# Patient Record
Sex: Female | Born: 1955 | Race: White | Hispanic: No | Marital: Married | Smoking: Former smoker
Health system: Southern US, Community
[De-identification: ages and names within clinical notes are randomized; demographics above are authoritative.]

## PROBLEM LIST (undated history)

## (undated) DIAGNOSIS — Z9221 Personal history of antineoplastic chemotherapy: Secondary | ICD-10-CM

## (undated) DIAGNOSIS — R0789 Other chest pain: Secondary | ICD-10-CM

## (undated) DIAGNOSIS — Z720 Tobacco use: Secondary | ICD-10-CM

## (undated) DIAGNOSIS — D229 Melanocytic nevi, unspecified: Secondary | ICD-10-CM

## (undated) DIAGNOSIS — C50919 Malignant neoplasm of unspecified site of unspecified female breast: Secondary | ICD-10-CM

## (undated) DIAGNOSIS — R002 Palpitations: Secondary | ICD-10-CM

## (undated) DIAGNOSIS — C541 Malignant neoplasm of endometrium: Secondary | ICD-10-CM

## (undated) DIAGNOSIS — L309 Dermatitis, unspecified: Secondary | ICD-10-CM

## (undated) HISTORY — DX: Tobacco use: Z72.0

## (undated) HISTORY — DX: Malignant neoplasm of unspecified site of unspecified female breast: C50.919

## (undated) HISTORY — DX: Other chest pain: R07.89

## (undated) HISTORY — DX: Palpitations: R00.2

## (undated) HISTORY — DX: Dermatitis, unspecified: L30.9

---
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
## 1997-10-10 HISTORY — PX: KNEE SURGERY: SHX244

## 2008-10-10 DIAGNOSIS — C50919 Malignant neoplasm of unspecified site of unspecified female breast: Secondary | ICD-10-CM

## 2008-10-10 HISTORY — PX: MASTECTOMY: SHX3

## 2008-10-10 HISTORY — DX: Malignant neoplasm of unspecified site of unspecified female breast: C50.919

## 2009-10-10 HISTORY — PX: AUGMENTATION MAMMAPLASTY: SUR837

## 2010-10-10 HISTORY — PX: ABDOMINAL HYSTERECTOMY: SHX81

## 2014-05-26 ENCOUNTER — Other Ambulatory Visit: Payer: Self-pay | Admitting: *Deleted

## 2014-06-11 ENCOUNTER — Other Ambulatory Visit: Payer: Self-pay | Admitting: *Deleted

## 2014-06-11 DIAGNOSIS — C50919 Malignant neoplasm of unspecified site of unspecified female breast: Secondary | ICD-10-CM

## 2014-06-12 ENCOUNTER — Other Ambulatory Visit: Payer: Self-pay | Admitting: *Deleted

## 2014-06-12 DIAGNOSIS — M8580 Other specified disorders of bone density and structure, unspecified site: Secondary | ICD-10-CM

## 2014-06-26 ENCOUNTER — Other Ambulatory Visit: Payer: Self-pay

## 2014-07-02 ENCOUNTER — Ambulatory Visit
Admission: RE | Admit: 2014-07-02 | Discharge: 2014-07-02 | Disposition: A | Discharge status: Home / Self Care | Payer: 59 | Source: Ambulatory Visit | Attending: *Deleted | Admitting: *Deleted

## 2014-07-02 DIAGNOSIS — M8580 Other specified disorders of bone density and structure, unspecified site: Secondary | ICD-10-CM

## 2014-07-09 ENCOUNTER — Telehealth: Payer: Self-pay | Admitting: *Deleted

## 2014-07-09 NOTE — Telephone Encounter (Signed)
Received referral from Medical Records today and I called the pt and confirmed 08/20/14 med onc appt w/ her.  Unable to schedule the lab appt - gave to [facility] to enter it for me.  Mailed welcoming packet, calendar and intake form to pt.  Laroy Apple at referring to make her aware.  Took paperwork to [employer] in Medical Records to create chart.

## 2014-07-30 ENCOUNTER — Encounter: Payer: Self-pay | Admitting: *Deleted

## 2014-07-30 NOTE — Progress Notes (Signed)
Completed chart, labs entered, added to spreadsheet & placed in Dr. Magrinat's box.  

## 2014-08-19 ENCOUNTER — Other Ambulatory Visit: Payer: Self-pay | Admitting: *Deleted

## 2014-08-19 DIAGNOSIS — Z853 Personal history of malignant neoplasm of breast: Secondary | ICD-10-CM

## 2014-08-20 ENCOUNTER — Encounter (INDEPENDENT_AMBULATORY_CARE_PROVIDER_SITE_OTHER): Payer: Self-pay

## 2014-08-20 ENCOUNTER — Ambulatory Visit (HOSPITAL_BASED_OUTPATIENT_CLINIC_OR_DEPARTMENT_OTHER): Payer: 59 | Admitting: Oncology

## 2014-08-20 ENCOUNTER — Ambulatory Visit (HOSPITAL_BASED_OUTPATIENT_CLINIC_OR_DEPARTMENT_OTHER): Payer: 59

## 2014-08-20 ENCOUNTER — Ambulatory Visit: Payer: 59

## 2014-08-20 VITALS — BP 117/65 | HR 75 | Temp 98.3°F | Resp 18 | Ht 66.0 in | Wt 167.2 lb

## 2014-08-20 DIAGNOSIS — Z853 Personal history of malignant neoplasm of breast: Secondary | ICD-10-CM

## 2014-08-20 DIAGNOSIS — E876 Hypokalemia: Secondary | ICD-10-CM

## 2014-08-20 DIAGNOSIS — C50412 Malignant neoplasm of upper-outer quadrant of left female breast: Secondary | ICD-10-CM

## 2014-08-20 DIAGNOSIS — Z170 Estrogen receptor positive status [ER+]: Secondary | ICD-10-CM

## 2014-08-20 DIAGNOSIS — C50912 Malignant neoplasm of unspecified site of left female breast: Secondary | ICD-10-CM

## 2014-08-20 DIAGNOSIS — R918 Other nonspecific abnormal finding of lung field: Secondary | ICD-10-CM

## 2014-08-20 LAB — COMPREHENSIVE METABOLIC PANEL
ALBUMIN: 3.9 g/dL (ref 3.5–5.2)
ALK PHOS: 77 U/L (ref 39–117)
ALT: 15 U/L (ref 0–35)
AST: 17 U/L (ref 0–37)
BUN: 9 mg/dL (ref 6–23)
CO2: 27 mEq/L (ref 19–32)
Calcium: 9.5 mg/dL (ref 8.4–10.5)
Chloride: 103 mEq/L (ref 96–112)
Creatinine, Ser: 0.69 mg/dL (ref 0.50–1.10)
GLUCOSE: 96 mg/dL (ref 70–99)
POTASSIUM: 3.8 meq/L (ref 3.5–5.3)
Sodium: 141 mEq/L (ref 135–145)
Total Protein: 7.3 g/dL (ref 6.0–8.3)

## 2014-08-20 LAB — CBC WITH DIFFERENTIAL/PLATELET
BASO%: 0.7 % (ref 0.0–2.0)
Basophils Absolute: 0.1 10*3/uL (ref 0.0–0.1)
EOS ABS: 0.2 10*3/uL (ref 0.0–0.5)
EOS%: 1.6 % (ref 0.0–7.0)
HCT: 41.4 % (ref 34.8–46.6)
HGB: 13.6 g/dL (ref 11.6–15.9)
LYMPH%: 55.7 % — AB (ref 14.0–49.7)
MCH: 30.3 pg (ref 25.1–34.0)
MCHC: 32.7 g/dL (ref 31.5–36.0)
MCV: 92.4 fL (ref 79.5–101.0)
MONO#: 0.6 10*3/uL (ref 0.1–0.9)
MONO%: 5.7 % (ref 0.0–14.0)
NEUT#: 3.8 10*3/uL (ref 1.5–6.5)
NEUT%: 36.3 % — ABNORMAL LOW (ref 38.4–76.8)
PLATELETS: 208 10*3/uL (ref 145–400)
RBC: 4.48 10*6/uL (ref 3.70–5.45)
RDW: 12.8 % (ref 11.2–14.5)
WBC: 10.6 10*3/uL — ABNORMAL HIGH (ref 3.9–10.3)
lymph#: 5.9 10*3/uL — ABNORMAL HIGH (ref 0.9–3.3)

## 2014-08-20 NOTE — Progress Notes (Signed)
Holly Melton  [phone] [phone]     ID: Holly Melton DOB: 1955-10-11  MR#: [id number]  CWC#:[id number]  Patient Care Team: No Pcp Per Patient as PCP - General (General Practice) Chauncey Cruel, MD as Consulting Physician (Oncology) OTHER MD: Ashok Pall MD  CHIEF COMPLAINT: estrogen receptor positive breast cancer  CURRENT TREATMENT: anastrozole   BREAST CANCER HISTORY: [place] underwent left mastectomy and sentinel lymph node sampling 01/19/2009 for an mpT1c pN0, stage IA invasive breast cancer, which was estrogen receptor positive at 96%, and HER-2 amplified at 3+. [I do not have the actual pathology report; the patient recalls the tumor being progesterone receptor negative, but there is a note from one of her physicians that it was progesterone receptor 80% positive). She was treated under the care of Dr. Luther Hearing in [place] with dose dense doxorubicin and cyclophosphamide followed by dose dense paclitaxel. She also received trastuzumab, which was started duriing the paclitaxel treatment and continued for 1 year. At the completion of chemotherapy she was started on tamoxifen, and continued on that medication between October 2010 and October 2012. In May 2011 she underwent reconstructive surgery with a silicone implant.On September 2012 the patient underwent TAH/BSO with benign pathology. She was then switched to anastrozole, on which she continues.  Her subsequent history is as detailed below, as are other not necessarily related issues which will require further evaluation  INTERVAL HISTORY: Holly Melton was evaluated in the breast clinic 08/20/2014. She is establishing herself on my service today.  REVIEW OF SYSTEMS: She is tolerating the anastrozole generally well. She does have some hot flashes, but they're not something she would want any intervention 4. She denies significant arthralgias or myalgias. Vaginal dryness is "not an issue".  She feels tired at times, sometimes has palpitations, possibly related to caffeine intake. She has chronic low back pain, which is not more persistent or intense than usual. Sometimes she has numbness over her fingertips and toe tips, but this is not persistent either. A detailed review of systems was otherwise noncontributory  PAST MEDICAL HISTORY: No past medical history on file. History of multiple lipomas, history of hypokalemia, history of an occipital bone lesion, history of pulmonary nodules of uncertain etiology, history of thyroid nodules of uncertain etiology, osteopenia, history of abnormal cervical pathology with HPV positivity, status post robotic total hysterectomy  PAST SURGICAL HISTORY: No past surgical history on file. History of emergency C-section, left knee surgery, lipoma removal, bilateral Copper tunnel, pneumonia (July 2010),status post robotic total hysterectomy bilateral salpingo-oophorectomy; removal of right foot plantar warts   FAMILY HISTORY No family history on file. The patient's father is currently living age 19. The patient's mother died at the age of 45. She was diagnosed with multiple myeloma at age 71. The patient had no brothers, 5 sisters. One sister has a history of melanoma, and another history of thyroid cancer. There is no other history of breast or ovarian cancer in the family except for a first cousin on the maternal side with breast cancer, triple negative, diagnosed at age 4.  GYNECOLOGIC HISTORY:  No LMP recorded. Menarche age 11, was perimenopausal at the time of breast cancer diagnosis in 2010, became menopausal with chemotherapy and underwent TAH/BSO in 2012. She did not use hormone replacement. She never used birth control pills.  SOCIAL HISTORY:  Normal works as a Copywriter, advertising. She trained herself in this field and only later took some courses inorganic and general chemistry. 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 The patient has been separated from her husband Holly Melton for 20 years. He lives in [place]. They are not legally divorced. She has a daughter from that marriage, Holly Melton, lives in [place] and is a homemaker.The patient moved to this area partly because a job opened here and partly because she always aimed to retire in [place]. At home she lives with her significant other, Engineer, manufacturing. [name] Works as an Dietitian and has 2 children of his own, who live in [place] and a plan. He has 2 grandsons. The patient herself has no biologicalgrandchildren. She is a Theatre manager    ADVANCED DIRECTIVES: at her 08/20/2014 visit the patient was given the appropriate documents to complete and notarize at her discretion. She tells me she intends to name her daughter Holly Melton as her healthcare up of attorney. [name] can be reached at [phone].   HEALTH MAINTENANCE: History  Substance Use Topics  . Smoking status: Not on file  . Smokeless tobacco: Not on file  . Alcohol Use: Not on file     Colonoscopy:  PAP:  Bone density:04/14/2012, T score -1.4  Lipid panel:  Allergies not on file  Current Outpatient Prescriptions  Medication Sig Dispense Refill  . anastrozole (ARIMIDEX) 1 MG tablet Take 1 mg by mouth daily.    . B Complex-C (SUPER B COMPLEX PO) Take 1 tablet by mouth daily.    . Biotin 5000 MCG CAPS Take 1 capsule by mouth daily.    . Calcium Carbonate-Vit D-Min (HM CALCIUM 600+D PLUS MINERALS PO) Take by mouth.    . Misc Natural Products (GLUCOSAMINE CHONDROITIN MSM PO) Take 2 tablets by mouth daily. Each tablet has 1525m    . Potassium Gluconate 595 MG CAPS Take 1 capsule by mouth daily.     No current facility-administered medications for this visit.    OBJECTIVE: middle-aged white woman in no acute distress Filed Vitals:   08/20/14 1639  BP: 117/65  Pulse: 75  Temp: 98.3 F (36.8 C)  Resp: 18     Body mass index is 27 kg/(m^2).    ECOG  FS:0 - Asymptomatic  Ocular: Sclerae unicteric, pupils equal, round and reactive to light Ear-nose-throat: Oropharynx clear and moist Lymphatic: No cervical or supraclavicular adenopathy Lungs no rales or rhonchi, good excursion bilaterally Heart regular rate and rhythm, no murmur appreciated Abd soft, nontender, positive bowel sounds MSK no focal spinal tenderness, no upper extremity edema Neuro: non-focal, well-oriented, appropriate affect Breasts: right breast is unremarkable. The left breast is status post mastectomy with silicone implant in place. There is no evidence of local recurrence. The left axilla is benign.   LAB RESULTS:  CMP     Component Value Date/Time   NA 141 08/20/2014 1620   K 3.8 08/20/2014 1620   CL 103 08/20/2014 1620   CO2 27 08/20/2014 1620   GLUCOSE 96 08/20/2014 1620   BUN 9 08/20/2014 1620   CREATININE 0.69 08/20/2014 1620   CALCIUM 9.5 08/20/2014 1620   PROT 7.3 08/20/2014 1620   ALBUMIN 3.9 08/20/2014 1620   AST 17 08/20/2014 1620   ALT 15 08/20/2014 1620   ALKPHOS 77 08/20/2014 1620   BILITOT <0.2* 08/20/2014 1620    INo results found for: SPEP, UPEP  Lab Results  Component Value Date   WBC 10.6* 08/20/2014   NEUTROABS 3.8 08/20/2014   HGB 13.6 08/20/2014   HCT 41.4 08/20/2014   MCV 92.4  08/20/2014   PLT 208 08/20/2014      Chemistry      Component Value Date/Time   NA 141 08/20/2014 1620   K 3.8 08/20/2014 1620   CL 103 08/20/2014 1620   CO2 27 08/20/2014 1620   BUN 9 08/20/2014 1620   CREATININE 0.69 08/20/2014 1620      Component Value Date/Time   CALCIUM 9.5 08/20/2014 1620   ALKPHOS 77 08/20/2014 1620   AST 17 08/20/2014 1620   ALT 15 08/20/2014 1620   BILITOT <0.2* 08/20/2014 1620       No results found for: LABCA2  No components found for: LABCA125  No results for input(s): INR in the last 168 hours.  Urinalysis No results found for: COLORURINE, APPEARANCEUR, LABSPEC, PHURINE, GLUCOSEU, HGBUR, BILIRUBINUR,  KETONESUR, PROTEINUR, UROBILINOGEN, NITRITE, LEUKOCYTESUR  STUDIES: No results found.  ASSESSMENT: 58 y.o. [place] woman recently relocated to [facility]  (1) status post left mastectomy and sentinel lymph node sampling April 2010 for an mpT1c pN0, stage IA invasive breast cancer, grade 2, estrogen receptor 96% positive, HER-2 amplified  (2) adjuvant chemotherapy consisted of dose dense cyclophosphamide and doxorubicin 4 followed by dose dense paclitaxel 4  (3) trastuzumab was initiated [place] the paclitaxel treatments and continued to complete a year (to August 2011)  (4) tamoxifen started October 2010 and continued through September 2012  (5) status post robotic TAH/BSO September 2012  (6) on anastrozole as of October 2012  (a) mild osteopenia by bone density July 2013  (7) genetics testing pending  ADDITIONAL CONCERNS:  (a) small pulmonary nodules noted on prior CT scans  (b) nonspecific thyroid nodules noted on prior ultrasounds  (c) occipital bone lesion noted by prior brain MRI  (d) multiple lipomas syndrome  (e) chronic hypokalemia, unexplained  PLAN: I spent well over an hour today with Nancee going over her situation in detail. She understands that she has completed 5 years of antiestrogen therapy. This included 2 years on tamoxifen and 3 years on anastrozole. We have good data that this switching approach is superior to simply continuing tamoxifen for 5 years.  However the data stops at 5 years. I cannot tell her that it is a good idea to continue the anastrozole, or that she must stop it. Lacking any data that continuing would be helpful in this stage I patient who also received anti-HER-2 treatment, I would normally discontinue the anastrozole at this point. However the patient understands her prior oncologist to have told her that the anastrozole should be continued for total of 5 years on that drug (a total of 7 years of antiestrogen therapy). The patient would  like to continue that plan. I have no data that that plan is erroneous-hyper-and there is simply no category 1 data to guide [place] here.  Accordingly the plan is to continue the anastrozole for an additional 2 years, through October 2017. Likely we will release Margurite from follow-up at that point.  In the meantime there are a few unanswered questions. The pulmonary nodules and thyroid nodules were being followed by her prior physician and we need baseline studies here. She also has a worrisome occipital bone lesion which is growing very slowly but is not completely stable. This also needs to be imaged. The patient is aware of her multiple lipoma syndrome and is comfortable "leaving that alone". She is interested in genetic testing and I have gone ahead and placed a referral for her.  Germaine will call for results of the various  studies that we have ordered. Otherwise she will return to see me in 3 months. She plans to go back to [place] in May to have her repeat mammogram at that time. She has a good understanding of the overall plan and agrees with it. She knows the goal of treatment in her case is cure. She will call with any problems that may develop before her next visit here.   The patient has a good understanding of the overall plan. She agrees with it. She knows the goal of treatment in her case is cure. She will call with any problems that may develop before her next visit here.  Chauncey Cruel, MD   08/20/2014 6:41 PM

## 2014-08-21 ENCOUNTER — Telehealth: Payer: Self-pay | Admitting: Oncology

## 2014-08-21 ENCOUNTER — Encounter: Payer: Self-pay | Admitting: Oncology

## 2014-08-21 LAB — TECHNOLOGIST REVIEW

## 2014-08-21 NOTE — Telephone Encounter (Signed)
, °

## 2014-08-21 NOTE — Progress Notes (Signed)
Checked in new patient with no issues prior to seeing the dr. She has primary and secondary. She has not traveled and has no pcp yet. Her oncologist in [place] is [facility]. Her date of birth is 10/25/1955. We had 19th. Changed in system,

## 2014-08-22 ENCOUNTER — Telehealth: Payer: Self-pay | Admitting: *Deleted

## 2014-08-22 NOTE — Telephone Encounter (Signed)
Received request from Dr. Jana Hakim for pt to see genetics.  Called pt and she needs to do the week of 11/23 due to work.  The only date/time I had available was during her radiology appts.  I informed her that I would call the counselor and see what we could work out and call her back.  Called Roma Kayser and she gave me a date and time.  Called pt back and got her voicemail.  Left a message asking her to return my call so I can schedule her.

## 2014-08-27 ENCOUNTER — Telehealth: Payer: Self-pay | Admitting: *Deleted

## 2014-08-27 NOTE — Telephone Encounter (Signed)
Pt returned my call and left a voicemail message on [employer].  I called pt and left her a message for her to return my call so I can schedule her genetic appt.

## 2014-08-29 ENCOUNTER — Telehealth: Payer: Self-pay | Admitting: *Deleted

## 2014-08-29 NOTE — Telephone Encounter (Signed)
Scheduled and confirmed genetics appt for pt on 09/02/14 at 0900. Pt denies further needs. Contact information given.

## 2014-09-01 ENCOUNTER — Ambulatory Visit (HOSPITAL_COMMUNITY)
Admission: RE | Admit: 2014-09-01 | Discharge: 2014-09-01 | Disposition: A | Discharge status: Home / Self Care | Payer: 59 | Source: Ambulatory Visit | Attending: Oncology | Admitting: Oncology

## 2014-09-01 ENCOUNTER — Encounter (HOSPITAL_COMMUNITY): Payer: Self-pay

## 2014-09-01 DIAGNOSIS — Z9012 Acquired absence of left breast and nipple: Secondary | ICD-10-CM | POA: Diagnosis not present

## 2014-09-01 DIAGNOSIS — R918 Other nonspecific abnormal finding of lung field: Secondary | ICD-10-CM | POA: Diagnosis not present

## 2014-09-01 DIAGNOSIS — Z85850 Personal history of malignant neoplasm of thyroid: Secondary | ICD-10-CM | POA: Diagnosis not present

## 2014-09-01 DIAGNOSIS — Z853 Personal history of malignant neoplasm of breast: Secondary | ICD-10-CM | POA: Insufficient documentation

## 2014-09-01 DIAGNOSIS — R911 Solitary pulmonary nodule: Secondary | ICD-10-CM | POA: Insufficient documentation

## 2014-09-01 DIAGNOSIS — C50912 Malignant neoplasm of unspecified site of left female breast: Secondary | ICD-10-CM | POA: Diagnosis not present

## 2014-09-01 MED ORDER — IOHEXOL 300 MG/ML  SOLN
80.0000 mL | Freq: Once | INTRAMUSCULAR | Status: AC | PRN
  Administered 2014-09-01: 80 mL via INTRAVENOUS

## 2014-09-02 ENCOUNTER — Encounter: Payer: Self-pay | Admitting: Genetic Counselor

## 2014-09-02 ENCOUNTER — Other Ambulatory Visit: Payer: 59

## 2014-09-02 ENCOUNTER — Ambulatory Visit (HOSPITAL_COMMUNITY)
Admission: RE | Admit: 2014-09-02 | Discharge: 2014-09-02 | Disposition: A | Discharge status: Home / Self Care | Payer: 59 | Source: Ambulatory Visit | Attending: Oncology | Admitting: Oncology

## 2014-09-02 ENCOUNTER — Ambulatory Visit (HOSPITAL_BASED_OUTPATIENT_CLINIC_OR_DEPARTMENT_OTHER): Payer: 59 | Admitting: Genetic Counselor

## 2014-09-02 ENCOUNTER — Other Ambulatory Visit: Payer: Self-pay | Admitting: Oncology

## 2014-09-02 DIAGNOSIS — Z808 Family history of malignant neoplasm of other organs or systems: Secondary | ICD-10-CM

## 2014-09-02 DIAGNOSIS — C50912 Malignant neoplasm of unspecified site of left female breast: Secondary | ICD-10-CM | POA: Diagnosis present

## 2014-09-02 DIAGNOSIS — Z800 Family history of malignant neoplasm of digestive organs: Secondary | ICD-10-CM

## 2014-09-02 DIAGNOSIS — C50919 Malignant neoplasm of unspecified site of unspecified female breast: Secondary | ICD-10-CM | POA: Insufficient documentation

## 2014-09-02 DIAGNOSIS — Z807 Family history of other malignant neoplasms of lymphoid, hematopoietic and related tissues: Secondary | ICD-10-CM

## 2014-09-02 DIAGNOSIS — Z803 Family history of malignant neoplasm of breast: Secondary | ICD-10-CM

## 2014-09-02 DIAGNOSIS — Z840 Family history of diseases of the skin and subcutaneous tissue: Secondary | ICD-10-CM

## 2014-09-02 DIAGNOSIS — Z315 Encounter for genetic counseling: Secondary | ICD-10-CM

## 2014-09-02 DIAGNOSIS — C50412 Malignant neoplasm of upper-outer quadrant of left female breast: Secondary | ICD-10-CM

## 2014-09-02 DIAGNOSIS — Z853 Personal history of malignant neoplasm of breast: Secondary | ICD-10-CM

## 2014-09-02 DIAGNOSIS — Z8349 Family history of other endocrine, nutritional and metabolic diseases: Secondary | ICD-10-CM

## 2014-09-02 DIAGNOSIS — D179 Benign lipomatous neoplasm, unspecified: Secondary | ICD-10-CM

## 2014-09-02 DIAGNOSIS — Z8489 Family history of other specified conditions: Secondary | ICD-10-CM

## 2014-09-02 MED ORDER — GADOBENATE DIMEGLUMINE 529 MG/ML IV SOLN
15.0000 mL | Freq: Once | INTRAVENOUS | Status: AC | PRN
  Administered 2014-09-02: 15 mL via INTRAVENOUS

## 2014-09-02 NOTE — Progress Notes (Signed)
Dr.  Sarajane Jews Magrinat requested a consultation for genetic counseling and risk assessment for Holly Melton, a 58 y.o. female, for discussion of her personal history of breast cancer, lipoma, thyroid nodules and fibroids and family history of breast and thyroid cancer, melanoma, multiple myeloma, thyroid disease (nodules and goiters), lipomas, colon polyps, and vascular malformations.  Holly Melton had a thyroid evaluation within the week, but she is not sure what it says.  Her cousin who had [place] breast cancer at 77 did not have genetic testing in the past because it was too expensive at that time to do.  She presents to clinic today, with her daughter Holly Melton, to discuss the possibility of a genetic predisposition to cancer, and to further clarify her risks, as well as her family members' risks for cancer.   HISTORY OF PRESENT ILLNESS: In 2010, at the age of 26, Holly Melton was diagnosed with invasive ductal carcinoma of the left breast.  From records, it is suggested that this tumor was triple positive. This was treated with chemotherapy and mastectomy.  Holly Melton has had a colonoscopy, but is unsure whether she had polyps.  Her daughter has PCOS and is on birth control, and is worried whether she needs to go off the pills to lower her risk for breast cancer.  Her daughter also has a thyroid nodule.    Past Medical History  Diagnosis Date  . Breast cancer 2010    History   Social History  . Marital Status: Married    Spouse Name: N/A    Number of Children: 1  . Years of Education: N/A   Social History Main Topics  . Smoking status: Current Every Day Smoker -- 0.75 packs/day for 45 years  . Smokeless tobacco: None  . Alcohol Use: No  . Drug Use: None  . Sexual Activity: None   Other Topics Concern  . None   Social History Narrative     No history exists.    REPRODUCTIVE HISTORY AND PERSONAL RISK ASSESSMENT FACTORS: Menarche was at age 66.   postmenopausal Uterus Intact:  no Ovaries Intact: no G2P1A1, first live birth at age 61  She has not previously undergone treatment for infertility.   Oral Contraceptive use: 0 years   She has not used HRT in the past.    FAMILY HISTORY:  We obtained a detailed, 4-generation family history.  Significant diagnoses are listed below: Family History  Problem Relation Age of Onset  . Multiple myeloma Mother 9  . Colon polyps Father   . Thyroid nodules Father   . Melanoma Sister 37    has had 2-3 times  . Goiter Paternal Aunt   . Liver cancer Maternal Grandmother 50  . Thyroid cancer Sister   . Asperger's syndrome Other   . Breast cancer Cousin 64    triple negative   Patient's maternal ancestors are of [place] descent, and paternal ancestors are of [place] and [place] descent. There is no reported Ashkenazi Jewish ancestry. There is no known consanguinity.  GENETIC COUNSELING ASSESSMENT: Holly Melton is a 58 y.o. female with a personal history of breast cancer, lipomas, thyroid nodules, and fibroids and a family history of TN breast cancer, melanoma, thyroid cancer, thyroid disease (nodules and goiter), lipomas, colon polyps and vascular malformations which somewhat suggestive of Cowden Syndrome as a result of mutations in PTEN, or other hereditary breast cancer syndrome and predisposition to cancer. We, therefore, discussed and recommended the following at today's visit.   DISCUSSION:  We reviewed the characteristics, features and inheritance patterns of hereditary cancer syndromes. We also discussed genetic testing, including the appropriate family members to test, the process of testing, insurance coverage and turn-around-time for results. We discussed that about 5-10% of breast cancer is hereditary.  The majority of individuals with hereditary breast cancer have BRCA mutations.  Based on Ms. Abts's personal and family history, I am also concerned about mutations in PTEN causing Cowden syndrome.  In order to  estimate her chance of having a PTEN mutation, we used statistical models (Risk Calculator for estimating PTEN mutations) and laboratory data that take into account her personal medical history, family history and ancestry.  Because each model is different, there can be a lot of variability in the risks they give.  Therefore, these numbers must be considered a rough range and not a precise risk of having a PTEN mutation.  This model estimates that she has approximately a ~2% chance of having a mutation. Based on this assessment of her family and personal history, genetic testing is recommended.  PLAN: After considering the risks, benefits, and limitations, Holly Melton provided informed consent to pursue genetic testing and the blood sample will be sent to [facility] for analysis of the BreastNext. We discussed the implications of a positive, negative and/ or variant of uncertain significance genetic test result. Results should be available within approximately 3-4 weeks' time, at which point they will be disclosed by telephone to Holly Melton, as will any additional recommendations warranted by these results. Holly Melton will receive a summary of her genetic counseling visit and a copy of her results once available. This information will also be available in Epic. We encouraged Holly Melton to remain in contact with cancer genetics annually so that we can continuously update the family history and inform her of any changes in cancer genetics and testing that may be of benefit for her family. Holly Melton's questions were answered to her satisfaction today. Our contact information was provided should additional questions or concerns arise.  The patient was seen for a total of 60 minutes, greater than 50% of which was spent face-to-face counseling.  This note will also be sent to the referring provider via the electronic medical record. The patient will be supplied with a summary of this  genetic counseling discussion as well as educational information on the discussed hereditary cancer syndromes following the conclusion of their visit.   This was reviewed by Drs. Magrinat, Lindi Adie and Regal and they agreed upon the plan.  _______________________________________________________________________ For Office Staff:  Number of people involved in session: 2 Was an Intern/ student involved with case: no

## 2014-09-03 ENCOUNTER — Other Ambulatory Visit: Payer: Self-pay | Admitting: Oncology

## 2014-10-06 ENCOUNTER — Encounter: Payer: Self-pay | Admitting: Genetic Counselor

## 2014-10-06 ENCOUNTER — Telehealth: Payer: Self-pay | Admitting: Genetic Counselor

## 2014-10-06 DIAGNOSIS — Z1379 Encounter for other screening for genetic and chromosomal anomalies: Secondary | ICD-10-CM | POA: Insufficient documentation

## 2014-10-06 NOTE — Telephone Encounter (Signed)
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  Sent a copy of her results via secure email.

## 2014-10-06 NOTE — Progress Notes (Signed)
HPI: Ms. Holbein was previously seen in the [place] clinic due to a personal and family history of cancer and concerns regarding a hereditary predisposition to cancer. Please refer to our prior cancer genetics clinic note for more information regarding Ms. Mcglynn's medical, social and family histories, and our assessment and recommendations, at the time. Ms. Smedley recent genetic test results were disclosed to her, as were recommendations warranted by these results. These results and recommendations are discussed in more detail below.  GENETIC TEST RESULTS: At the time of Ms. Cifelli's visit, we recommended she pursue genetic testing of the BreastNext gene panel. The BreastNext gene panel offered by [facility] and includes sequencing and rearrangement analysis for the following 17 genes: ATM, BARD1, BRCA1, BRCA2, BRIP1, CDH1, CHEK2, MRE11A, MUTYH, NBN, NF1, PALB2, PTEN, RAD50, RAD51C, RAD51D, and TP53.  The report date is October 01, 2014.  Testing was performed at [facility]. 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 The test report has been scanned into EPIC and is located under the Media tab. Ms. Kitch was sent her test results via secure email.  We discussed with Ms. Rivard that since the current genetic testing is not perfect, it is possible there may be a gene mutation in one of these genes that current testing cannot detect, but that chance is small. We also discussed, that it is possible that another gene that has not yet been discovered, or that we have not yet tested, is responsible for the cancer diagnoses in the family, and it is, therefore, important to remain in touch with cancer genetics in the future so that we can continue to offer Ms. Maxfield the most up to date genetic testing.   CANCER SCREENING RECOMMENDATIONS: This result is reassuring and suggests  that Ms. Buchta's cancer was most likely not due to an inherited predisposition associated with one of these genes. Most cancers happen by chance and this negative test, along with details of her family history, suggests that her cancer falls into this category. We, therefore, recommended she continue to follow the cancer management and screening guidelines provided by her oncology and primary providers.   RECOMMENDATIONS FOR FAMILY MEMBERS: Women in this family might be at some increased risk of developing cancer, over the general population risk, simply due to the family history of cancer. We recommended women in this family have a yearly mammogram beginning at age 39, an an annual clinical breast exam, and perform monthly breast self-exams. Women in this family should also have a gynecological exam as recommended by their primary provider. All family members should have a colonoscopy by age 73.  FOLLOW-UP: Lastly, we discussed with Ms. Smitherman that cancer genetics is a rapidly advancing field and it is possible that new genetic tests will be appropriate for her and/or her family members in the future. We encouraged her to remain in contact with cancer genetics on an annual basis so we can update her personal and family histories and let her know of advances in cancer genetics that may benefit this family.   Our contact number was provided. Ms.. Mogensen questions were answered to her satisfaction, and she knows she is welcome to call us at anytime with additional questions or concerns.   Roma Kayser, MS, [facility] Certified Genetic Counselor [email or web address]

## 2014-10-15 ENCOUNTER — Other Ambulatory Visit: Payer: Self-pay | Admitting: Oncology

## 2014-10-15 NOTE — Progress Notes (Unsigned)
At the time of Holly Melton's visit, we recommended she pursue genetic testing of the BreastNext gene panel. The BreastNext gene panel offered by [facility] and includes sequencing and rearrangement analysis for the following 17 genes: ATM, BARD1, BRCA1, BRCA2, BRIP1, CDH1, CHEK2, MRE11A, MUTYH, NBN, NF1, PALB2, PTEN, RAD50, RAD51C, RAD51D, and TP53. The report date is October 01, 2014. Testing was performed at [facility]. 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 The test report has been scanned into EPIC and is located under the Media tab. [name] was sent her test results via secure email.

## 2014-10-21 ENCOUNTER — Encounter (HOSPITAL_COMMUNITY): Payer: Self-pay | Admitting: Emergency Medicine

## 2014-10-21 ENCOUNTER — Emergency Department (INDEPENDENT_AMBULATORY_CARE_PROVIDER_SITE_OTHER)
Admission: EM | Admit: 2014-10-21 | Discharge: 2014-10-21 | Disposition: A | Discharge status: Home / Self Care | Payer: 59 | Source: Home / Self Care | Attending: Emergency Medicine | Admitting: Emergency Medicine

## 2014-10-21 DIAGNOSIS — J209 Acute bronchitis, unspecified: Secondary | ICD-10-CM

## 2014-10-21 MED ORDER — IPRATROPIUM BROMIDE HFA 17 MCG/ACT IN AERS: 2.0000 | INHALATION_SPRAY | Freq: Four times a day (QID) | RESPIRATORY_TRACT | Status: DC | PRN

## 2014-10-21 MED ORDER — PREDNISONE 10 MG PO TABS: ORAL_TABLET | ORAL | Status: DC

## 2014-10-21 MED ORDER — BENZONATATE 100 MG PO CAPS: 100.0000 mg | ORAL_CAPSULE | Freq: Three times a day (TID) | ORAL | Status: DC | PRN

## 2014-10-21 MED ORDER — ALBUTEROL SULFATE HFA 108 (90 BASE) MCG/ACT IN AERS: 1.0000 | INHALATION_SPRAY | Freq: Four times a day (QID) | RESPIRATORY_TRACT | Status: DC | PRN

## 2014-10-21 NOTE — ED Provider Notes (Signed)
CSN: [id number]     Arrival date & time 10/21/14  1826 History   First MD Initiated Contact with Patient 10/21/14 1907     Chief Complaint  Patient presents with  . Cough   (Consider location/radiation/quality/duration/timing/severity/associated sxs/prior Treatment) HPI Comments: States she feels otherwise well.  Smoker PCP: none Works as Copywriter, advertising for [employer]  Patient is a 59 y.o. female presenting with cough. The history is provided by the patient.  Cough Cough characteristics:  Hacking and dry Severity:  Moderate Onset quality:  Gradual Duration:  1 week Timing:  Constant Progression:  Unchanged Chronicity:  New Smoker: yes   Associated symptoms: rhinorrhea, sinus congestion and wheezing   Associated symptoms: no chest pain, no chills, no diaphoresis, no ear fullness, no ear pain, no eye discharge, no fever, no headaches, no myalgias, no rash, no shortness of breath, no sore throat and no weight loss     Past Medical History  Diagnosis Date  . Breast cancer 2010   Past Surgical History  Procedure Laterality Date  . Abdominal hysterectomy  2012    TAH/BSO  . Mastectomy Left   . Cesarean section     Family History  Problem Relation Age of Onset  . Multiple myeloma Mother 12  . Colon polyps Father   . Thyroid nodules Father   . Melanoma Sister 59    has had 2-3 times  . Goiter Paternal Aunt   . Liver cancer Maternal Grandmother 62  . Thyroid cancer Sister   . Asperger's syndrome Other   . Breast cancer Cousin 49    triple negative   History  Substance Use Topics  . Smoking status: Current Every Day Smoker -- 0.75 packs/day for 45 years  . Smokeless tobacco: Not on file  . Alcohol Use: No   OB History    No data available     Review of Systems  Constitutional: Negative for fever, chills, weight loss and diaphoresis.  HENT: Positive for congestion, rhinorrhea and sinus pressure. Negative for ear pain, hearing loss, mouth sores,  nosebleeds, postnasal drip and sore throat.   Eyes: Negative for discharge.  Respiratory: Positive for cough and wheezing. Negative for chest tightness and shortness of breath.   Cardiovascular: Negative for chest pain.  Gastrointestinal: Negative.   Genitourinary: Negative.   Musculoskeletal: Negative for myalgias and back pain.  Skin: Negative for rash.  Neurological: Negative for headaches.    Allergies  Indomethacin  Home Medications   Prior to Admission medications   Medication Sig Start Date End Date Taking? Authorizing Provider  GuaiFENesin (MUCINEX PO) Take by mouth.   Yes Historical Provider, MD  Pseudoeph-CPM-DM-APAP (TYLENOL COLD PO) Take by mouth.   Yes Historical Provider, MD  albuterol (PROVENTIL HFA;VENTOLIN HFA) 108 (90 BASE) MCG/ACT inhaler Inhale 1-2 puffs into the lungs every 6 (six) hours as needed for wheezing or shortness of breath (or persistent cough). 10/21/14   Audelia Hives Avalin Briley, PA  anastrozole (ARIMIDEX) 1 MG tablet Take 1 mg by mouth daily.    Historical Provider, MD  B Complex-C (SUPER B COMPLEX PO) Take 1 tablet by mouth daily.    Historical Provider, MD  benzonatate (TESSALON) 100 MG capsule Take 1 capsule (100 mg total) by mouth 3 (three) times daily as needed for cough. 10/21/14   Audelia Hives Virat Prather, PA  Biotin 5000 MCG CAPS Take 1 capsule by mouth daily.    Historical Provider, MD  Calcium Carbonate-Vit D-Min (HM CALCIUM 600+D  PLUS MINERALS PO) Take by mouth.    Historical Provider, MD  Misc Natural Products (GLUCOSAMINE CHONDROITIN MSM PO) Take 2 tablets by mouth daily. Each tablet has 1577m    Historical Provider, MD  Potassium Gluconate 595 MG CAPS Take 1 capsule by mouth daily.    Historical Provider, MD  predniSONE (DELTASONE) 10 MG tablet Take 5 tabs po QD day 1, 4 tabs po QD day 2, 3 tabs po QD day 3, 2 tabs po QD day 4, 1 tab po QD day 5 then stop 10/21/14   JAnnett GulaH Dayjah Selman, PA   BP 114/70 mmHg  Pulse 75  Temp(Src) 98.1 F (36.7  C) (Oral)  Resp 18  SpO2 98% Physical Exam  Constitutional: She is oriented to person, place, and time. She appears well-developed and well-nourished. No distress.  HENT:  Head: Normocephalic and atraumatic.  Right Ear: Hearing, tympanic membrane, external ear and ear canal normal.  Left Ear: Hearing, tympanic membrane, external ear and ear canal normal.  Nose: Nose normal.  Mouth/Throat: Uvula is midline, oropharynx is clear and moist and mucous membranes are normal.  Eyes: Conjunctivae are normal. Right eye exhibits no discharge. Left eye exhibits no discharge.  Neck: Normal range of motion. Neck supple.  Cardiovascular: Normal rate, regular rhythm and normal heart sounds.   Pulmonary/Chest: Effort normal. No accessory muscle usage. No respiratory distress. She has no decreased breath sounds. She has wheezes.  +mild bilateral end-expiratory wheezes  Musculoskeletal: Normal range of motion.  Lymphadenopathy:    She has no cervical adenopathy.  Neurological: She is alert and oriented to person, place, and time.  Skin: Skin is warm and dry.  Psychiatric: She has a normal mood and affect. Her behavior is normal.  Nursing note and vitals reviewed.   ED Course  Procedures (including critical care time) Labs Review Labs Reviewed - No data to display  Imaging Review No results found.   MDM   1. Bronchitis, acute, with bronchospasm    Mild acute on chronic bronchitis without indication of CAP. No fever, hypoxia or chest pain. +mild bronchospasm.  Will treat with prednisone taper, cough suppressant and inhaled bronchodilator. Follow up if no improvement.     JLutricia Feil PUtah01/12/16 1943

## 2014-10-21 NOTE — Discharge Instructions (Signed)
Cough, Adult ° A cough is a reflex that helps clear your throat and airways. It can help heal the body or may be a reaction to an irritated airway. A cough may only last 2 or 3 weeks (acute) or may last more than 8 weeks (chronic).  °CAUSES °Acute cough: °· Viral or bacterial infections. °Chronic cough: °· Infections. °· Allergies. °· Asthma. °· Post-nasal drip. °· Smoking. °· Heartburn or acid reflux. °· Some medicines. °· Chronic lung problems (COPD). °· Cancer. °SYMPTOMS  °· Cough. °· Fever. °· Chest pain. °· Increased breathing rate. °· High-pitched whistling sound when breathing (wheezing). °· Colored mucus that you cough up (sputum). °TREATMENT  °· A bacterial cough may be treated with antibiotic medicine. °· A viral cough must run its course and will not respond to antibiotics. °· Your caregiver may recommend other treatments if you have a chronic cough. °HOME CARE INSTRUCTIONS  °· Only take over-the-counter or prescription medicines for pain, discomfort, or fever as directed by your caregiver. Use cough suppressants only as directed by your caregiver. °· Use a cold steam vaporizer or humidifier in your bedroom or home to help loosen secretions. °· Sleep in a semi-upright position if your cough is worse at night. °· Rest as needed. °· Stop smoking if you smoke. °SEEK IMMEDIATE MEDICAL CARE IF:  °· You have pus in your sputum. °· Your cough starts to worsen. °· You cannot control your cough with suppressants and are losing sleep. °· You begin coughing up blood. °· You have difficulty breathing. °· You develop pain which is getting worse or is uncontrolled with medicine. °· You have a fever. °MAKE SURE YOU:  °· Understand these instructions. °· Will watch your condition. °· Will get help right away if you are not doing well or get worse. °Document Released: 03/25/2011 Document Revised: 12/19/2011 Document Reviewed: 03/25/2011 °ExitCare® Patient Information ©2015 [employer]. This information is not intended  to replace advice given to you by your health care provider. Make sure you discuss any questions you have with your health care provider. °Upper Respiratory Infection, Adult °An upper respiratory infection (URI) is also sometimes known as the common cold. The upper respiratory tract includes the nose, sinuses, throat, trachea, and bronchi. Bronchi are the airways leading to the lungs. Most people improve within 1 week, but symptoms can last up to 2 weeks. A residual cough may last even longer.  °CAUSES °Many different viruses can infect the tissues lining the upper respiratory tract. The tissues become irritated and inflamed and often become very moist. Mucus production is also common. A cold is contagious. You can easily spread the virus to others by oral contact. This includes kissing, sharing a glass, coughing, or sneezing. Touching your mouth or nose and then touching a surface, which is then touched by another person, can also spread the virus. °SYMPTOMS  °Symptoms typically develop 1 to 3 days after you come in contact with a cold virus. Symptoms vary from person to person. They may include: °· Runny nose. °· Sneezing. °· Nasal congestion. °· Sinus irritation. °· Sore throat. °· Loss of voice (laryngitis). °· Cough. °· Fatigue. °· Muscle aches. °· Loss of appetite. °· Headache. °· Low-grade fever. °DIAGNOSIS  °You might diagnose your own cold based on familiar symptoms, since most people get a cold 2 to 3 times a year. Your caregiver can confirm this based on your exam. Most importantly, your caregiver can check that your symptoms are not due to another disease such   as strep throat, sinusitis, pneumonia, asthma, or epiglottitis. Blood tests, throat tests, and X-rays are not necessary to diagnose a common cold, but they may sometimes be helpful in excluding other more serious diseases. Your caregiver will decide if any further tests are required. °RISKS AND COMPLICATIONS  °You may be at risk for a more severe  case of the common cold if you smoke cigarettes, have chronic heart disease (such as heart failure) or lung disease (such as asthma), or if you have a weakened immune system. The very young and very old are also at risk for more serious infections. Bacterial sinusitis, middle ear infections, and bacterial pneumonia can complicate the common cold. The common cold can worsen asthma and chronic obstructive pulmonary disease (COPD). Sometimes, these complications can require emergency medical care and may be life-threatening. °PREVENTION  °The best way to protect against getting a cold is to practice good hygiene. Avoid oral or hand contact with people with cold symptoms. Wash your hands often if contact occurs. There is no clear evidence that vitamin C, vitamin E, echinacea, or exercise reduces the chance of developing a cold. However, it is always recommended to get plenty of rest and practice good nutrition. °TREATMENT  °Treatment is directed at relieving symptoms. There is no cure. Antibiotics are not effective, because the infection is caused by a virus, not by bacteria. Treatment may include: °· Increased fluid intake. Sports drinks offer valuable electrolytes, sugars, and fluids. °· Breathing heated mist or steam (vaporizer or shower). °· Eating chicken soup or other clear broths, and maintaining good nutrition. °· Getting plenty of rest. °· Using gargles or lozenges for comfort. °· Controlling fevers with ibuprofen or acetaminophen as directed by your caregiver. °· Increasing usage of your inhaler if you have asthma. °Zinc gel and zinc lozenges, taken in the first 24 hours of the common cold, can shorten the duration and lessen the severity of symptoms. Pain medicines may help with fever, muscle aches, and throat pain. A variety of non-prescription medicines are available to treat congestion and runny nose. Your caregiver can make recommendations and may suggest nasal or lung inhalers for other symptoms.  °HOME  CARE INSTRUCTIONS  °· Only take over-the-counter or prescription medicines for pain, discomfort, or fever as directed by your caregiver. °· Use a warm mist humidifier or inhale steam from a shower to increase air moisture. This may keep secretions moist and make it easier to breathe. °· Drink enough water and fluids to keep your urine clear or pale yellow. °· Rest as needed. °· Return to work when your temperature has returned to normal or as your caregiver advises. You may need to stay home longer to avoid infecting others. You can also use a face mask and careful hand washing to prevent spread of the virus. °SEEK MEDICAL CARE IF:  °· After the first few days, you feel you are getting worse rather than better. °· You need your caregiver's advice about medicines to control symptoms. °· You develop chills, worsening shortness of breath, or brown or red sputum. These may be signs of pneumonia. °· You develop yellow or brown nasal discharge or pain in the face, especially when you bend forward. These may be signs of sinusitis. °· You develop a fever, swollen neck glands, pain with swallowing, or white areas in the back of your throat. These may be signs of strep throat. °SEEK IMMEDIATE MEDICAL CARE IF:  °· You have a fever. °· You develop severe or persistent headache, ear   pain, sinus pain, or chest pain. °· You develop wheezing, a prolonged cough, cough up blood, or have a change in your usual mucus (if you have chronic lung disease). °· You develop sore muscles or a stiff neck. °Document Released: 03/22/2001 Document Revised: 12/19/2011 Document Reviewed: 01/01/2014 °ExitCare® Patient Information ©2015 [employer]. This information is not intended to replace advice given to you by your health care provider. Make sure you discuss any questions you have with your health care provider. ° °

## 2014-10-21 NOTE — ED Notes (Signed)
Cough and congestion, predominantly in early am and in the evening.  No fever, and does not feel bad.

## 2014-10-23 ENCOUNTER — Ambulatory Visit (HOSPITAL_COMMUNITY): Payer: 59 | Attending: Emergency Medicine

## 2014-10-23 ENCOUNTER — Emergency Department (INDEPENDENT_AMBULATORY_CARE_PROVIDER_SITE_OTHER)
Admission: EM | Admit: 2014-10-23 | Discharge: 2014-10-23 | Disposition: A | Discharge status: Home / Self Care | Payer: 59 | Source: Home / Self Care | Attending: Family Medicine | Admitting: Family Medicine

## 2014-10-23 ENCOUNTER — Encounter (HOSPITAL_COMMUNITY): Payer: Self-pay | Admitting: Emergency Medicine

## 2014-10-23 DIAGNOSIS — F17200 Nicotine dependence, unspecified, uncomplicated: Secondary | ICD-10-CM | POA: Diagnosis not present

## 2014-10-23 DIAGNOSIS — R05 Cough: Secondary | ICD-10-CM | POA: Diagnosis not present

## 2014-10-23 DIAGNOSIS — J069 Acute upper respiratory infection, unspecified: Secondary | ICD-10-CM

## 2014-10-23 MED ORDER — AZITHROMYCIN 250 MG PO TABS: ORAL_TABLET | ORAL | Status: DC

## 2014-10-23 NOTE — ED Notes (Signed)
Pt states that she was seen here a few days ago but her symptoms have worsened and she is feeling no better from last visit or onset of symptoms that started 2 weeks ago

## 2014-10-23 NOTE — Discharge Instructions (Signed)
Upper Respiratory Infection, Adult An upper respiratory infection (URI) is also sometimes known as the common cold. The upper respiratory tract includes the nose, sinuses, throat, trachea, and bronchi. Bronchi are the airways leading to the lungs. Most people improve within 1 week, but symptoms can last up to 2 weeks. A residual cough may last even longer.  CAUSES Many different viruses can infect the tissues lining the upper respiratory tract. The tissues become irritated and inflamed and often become very moist. Mucus production is also common. A cold is contagious. You can easily spread the virus to others by oral contact. This includes kissing, sharing a glass, coughing, or sneezing. Touching your mouth or nose and then touching a surface, which is then touched by another person, can also spread the virus. SYMPTOMS  Symptoms typically develop 1 to 3 days after you come in contact with a cold virus. Symptoms vary from person to person. They may include:  Runny nose.  Sneezing.  Nasal congestion.  Sinus irritation.  Sore throat.  Loss of voice (laryngitis).  Cough.  Fatigue.  Muscle aches.  Loss of appetite.  Headache.  Low-grade fever. DIAGNOSIS  You might diagnose your own cold based on familiar symptoms, since most people get a cold 2 to 3 times a year. Your caregiver can confirm this based on your exam. Most importantly, your caregiver can check that your symptoms are not due to another disease such as strep throat, sinusitis, pneumonia, asthma, or epiglottitis. Blood tests, throat tests, and X-rays are not necessary to diagnose a common cold, but they may sometimes be helpful in excluding other more serious diseases. Your caregiver will decide if any further tests are required. RISKS AND COMPLICATIONS  You may be at risk for a more severe case of the common cold if you smoke cigarettes, have chronic heart disease (such as heart failure) or lung disease (such as asthma), or if  you have a weakened immune system. The very young and very old are also at risk for more serious infections. Bacterial sinusitis, middle ear infections, and bacterial pneumonia can complicate the common cold. The common cold can worsen asthma and chronic obstructive pulmonary disease (COPD). Sometimes, these complications can require emergency medical care and may be life-threatening. PREVENTION  The best way to protect against getting a cold is to practice good hygiene. Avoid oral or hand contact with people with cold symptoms. Wash your hands often if contact occurs. There is no clear evidence that vitamin C, vitamin E, echinacea, or exercise reduces the chance of developing a cold. However, it is always recommended to get plenty of rest and practice good nutrition. TREATMENT  Treatment is directed at relieving symptoms. There is no cure. Antibiotics are not effective, because the infection is caused by a virus, not by bacteria. Treatment may include:  Increased fluid intake. Sports drinks offer valuable electrolytes, sugars, and fluids.  Breathing heated mist or steam (vaporizer or shower).  Eating chicken soup or other clear broths, and maintaining good nutrition.  Getting plenty of rest.  Using gargles or lozenges for comfort.  Controlling fevers with ibuprofen or acetaminophen as directed by your caregiver.  Increasing usage of your inhaler if you have asthma. Zinc gel and zinc lozenges, taken in the first 24 hours of the common cold, can shorten the duration and lessen the severity of symptoms. Pain medicines may help with fever, muscle aches, and throat pain. A variety of non-prescription medicines are available to treat congestion and runny nose. Your caregiver   can make recommendations and may suggest nasal or lung inhalers for other symptoms.  HOME CARE INSTRUCTIONS   Only take over-the-counter or prescription medicines for pain, discomfort, or fever as directed by your  caregiver.  Use a warm mist humidifier or inhale steam from a shower to increase air moisture. This may keep secretions moist and make it easier to breathe.  Drink enough water and fluids to keep your urine clear or pale yellow.  Rest as needed.  Return to work when your temperature has returned to normal or as your caregiver advises. You may need to stay home longer to avoid infecting others. You can also use a face mask and careful hand washing to prevent spread of the virus. SEEK MEDICAL CARE IF:   After the first few days, you feel you are getting worse rather than better.  You need your caregiver's advice about medicines to control symptoms.  You develop chills, worsening shortness of breath, or brown or red sputum. These may be signs of pneumonia.  You develop yellow or brown nasal discharge or pain in the face, especially when you bend forward. These may be signs of sinusitis.  You develop a fever, swollen neck glands, pain with swallowing, or white areas in the back of your throat. These may be signs of strep throat. SEEK IMMEDIATE MEDICAL CARE IF:   You have a fever.  You develop severe or persistent headache, ear pain, sinus pain, or chest pain.  You develop wheezing, a prolonged cough, cough up blood, or have a change in your usual mucus (if you have chronic lung disease).  You develop sore muscles or a stiff neck. Document Released: 03/22/2001 Document Revised: 12/19/2011 Document Reviewed: 01/01/2014 [employer] Patient Information 2015 [employer]. This information is not intended to replace advice given to you by your health care provider. Make sure you discuss any questions you have with your health care provider.  

## 2014-10-23 NOTE — ED Provider Notes (Signed)
CSN: [id number]     Arrival date & time 10/23/14  0802 History   First MD Initiated Contact with Patient 10/23/14 [phone]     Chief Complaint  Patient presents with  . Nasal Congestion  . Cough   (Consider location/radiation/quality/duration/timing/severity/associated sxs/prior Treatment) HPI         59 year old female presents complaining of upper respiratory infection. She has had this for about 10 days. She has cough, nasal congestion, sore throat with coughing. The past 2 days she has also had mild shortness of breath or your symptoms are worse in the morning at night, she feels okay during the day. She denies fever, chills, NVD. No recent travel or sick contacts. No history of pneumonia. She was seen here 2 days ago and was prescribed prednisone, Tessalon Perles, and an albuterol inhaler. She has used 1 puff of the inhaler. She has not used [employer] boy oral prednisone because she says she doesn't think they will work. She says she knows she has an upper respiratory infection and she needs erythromycin. She is upset that she did not get antibiotics at her last visit   Past Medical History  Diagnosis Date  . Breast cancer 2010   Past Surgical History  Procedure Laterality Date  . Abdominal hysterectomy  2012    TAH/BSO  . Mastectomy Left   . Cesarean section     Family History  Problem Relation Age of Onset  . Multiple myeloma Mother 75  . Colon polyps Father   . Thyroid nodules Father   . Melanoma Sister 57    has had 2-3 times  . Goiter Paternal Aunt   . Liver cancer Maternal Grandmother 33  . Thyroid cancer Sister   . Asperger's syndrome Other   . Breast cancer Cousin 49    triple negative   History  Substance Use Topics  . Smoking status: Current Every Day Smoker -- 0.75 packs/day for 45 years  . Smokeless tobacco: Not on file  . Alcohol Use: No   OB History    No data available     Review of Systems  Constitutional: Negative for fever and chills.  HENT:  Positive for congestion, rhinorrhea and sore throat. Negative for ear pain and sinus pressure.   Respiratory: Positive for cough and shortness of breath.   Cardiovascular: Negative for chest pain.  Gastrointestinal: Negative for nausea, vomiting and diarrhea.  Skin: Negative for rash.  All other systems reviewed and are negative.   Allergies  Indomethacin  Home Medications   Prior to Admission medications   Medication Sig Start Date End Date Taking? Authorizing Provider  albuterol (PROVENTIL HFA;VENTOLIN HFA) 108 (90 BASE) MCG/ACT inhaler Inhale 1-2 puffs into the lungs every 6 (six) hours as needed for wheezing or shortness of breath (or persistent cough). 10/21/14   Audelia Hives Presson, PA  anastrozole (ARIMIDEX) 1 MG tablet Take 1 mg by mouth daily.    Historical Provider, MD  azithromycin (ZITHROMAX Z-PAK) 250 MG tablet Use as directed 10/23/14   Liam Graham, PA-C  B Complex-C (SUPER B COMPLEX PO) Take 1 tablet by mouth daily.    Historical Provider, MD  benzonatate (TESSALON) 100 MG capsule Take 1 capsule (100 mg total) by mouth 3 (three) times daily as needed for cough. 10/21/14   Audelia Hives Presson, PA  Biotin 5000 MCG CAPS Take 1 capsule by mouth daily.    Historical Provider, MD  Calcium Carbonate-Vit D-Min (HM CALCIUM 600+D PLUS MINERALS PO) Take  by mouth.    Historical Provider, MD  GuaiFENesin (MUCINEX PO) Take by mouth.    Historical Provider, MD  ipratropium (ATROVENT HFA) 17 MCG/ACT inhaler Inhale 2 puffs into the lungs every 6 (six) hours as needed for wheezing. 10/21/14   Lutricia Feil, PA  Misc Natural Products (GLUCOSAMINE CHONDROITIN MSM PO) Take 2 tablets by mouth daily. Each tablet has 1535m    Historical Provider, MD  Potassium Gluconate 595 MG CAPS Take 1 capsule by mouth daily.    Historical Provider, MD  predniSONE (DELTASONE) 10 MG tablet Take 5 tabs po QD day 1, 4 tabs po QD day 2, 3 tabs po QD day 3, 2 tabs po QD day 4, 1 tab po QD day 5 then  stop 10/21/14   JAudelia HivesPresson, PA  Pseudoeph-CPM-DM-APAP (TYLENOL COLD PO) Take by mouth.    Historical Provider, MD   BP 166/78 mmHg  Pulse 74  Temp(Src) 98.4 F (36.9 C) (Oral)  Resp 16  SpO2 98% Physical Exam  Constitutional: She is oriented to person, place, and time. Vital signs are normal. She appears well-developed and well-nourished. No distress.  HENT:  Head: Normocephalic and atraumatic.  Right Ear: Tympanic membrane, external ear and ear canal normal.  Left Ear: Tympanic membrane, external ear and ear canal normal.  Nose: Nose normal. Right sinus exhibits no maxillary sinus tenderness and no frontal sinus tenderness. Left sinus exhibits no maxillary sinus tenderness and no frontal sinus tenderness.  Mouth/Throat: Uvula is midline, oropharynx is clear and moist and mucous membranes are normal. No oropharyngeal exudate.  Eyes: Conjunctivae are normal. Right eye exhibits no discharge. Left eye exhibits no discharge.  Neck: Normal range of motion. Neck supple.  Cardiovascular: Normal rate, regular rhythm and normal heart sounds.   Pulmonary/Chest: Effort normal. No respiratory distress. She has wheezes. She has no rhonchi. She has no rales.  Lymphadenopathy:    She has no cervical adenopathy.  Neurological: She is alert and oriented to person, place, and time. She has normal strength. Coordination normal.  Skin: Skin is warm and dry. No rash noted. She is not diaphoretic.  Psychiatric: She has a normal mood and affect. Judgment normal.  Nursing note and vitals reviewed.   ED Course  Procedures (including critical care time) Labs Review Labs Reviewed - No data to display  Imaging Review Dg Chest 2 View  10/23/2014   CLINICAL DATA:  Cough. Upper respiratory infection for 2 weeks. History of left mastectomy. Current smoker.  EXAM: CHEST  2 VIEW  COMPARISON:  Chest CT 09/01/2014  FINDINGS: The cardiomediastinal silhouette is within normal limits. The lungs are  hyperinflated and clear. No pleural effusion or pneumothorax is identified. Left axillary surgical clips and left breast reconstruction are noted. No acute osseous abnormality is identified.  IMPRESSION: No active cardiopulmonary disease.   Electronically Signed   By: ALogan Bores  On: 10/23/2014 09:12     MDM   1. URI (upper respiratory infection)    Chest x-rays negative. Advised her to continue treating symptomatically, postdated prescription for azithromycin provided. Follow-up when necessary   Meds ordered this encounter  Medications  . azithromycin (ZITHROMAX Z-PAK) 250 MG tablet    Sig: Use as directed    Dispense:  6 each    Refill:  0    Order Specific Question:  Supervising Provider    Answer:  KIhor GullyD [[place] PA-C 10/23/14 [phone]

## 2014-12-19 ENCOUNTER — Telehealth: Payer: Self-pay | Admitting: Oncology

## 2014-12-19 ENCOUNTER — Other Ambulatory Visit: Payer: Self-pay | Admitting: *Deleted

## 2014-12-19 DIAGNOSIS — C50412 Malignant neoplasm of upper-outer quadrant of left female breast: Secondary | ICD-10-CM

## 2014-12-19 NOTE — Telephone Encounter (Signed)
pt called to cx appt...done... °

## 2014-12-22 ENCOUNTER — Other Ambulatory Visit: Payer: 59

## 2014-12-29 ENCOUNTER — Ambulatory Visit: Payer: 59 | Admitting: Oncology

## 2015-03-23 ENCOUNTER — Ambulatory Visit
Admission: RE | Admit: 2015-03-23 | Discharge: 2015-03-23 | Disposition: A | Discharge status: Home / Self Care | Payer: 59 | Source: Ambulatory Visit | Attending: Chiropractic Medicine | Admitting: Chiropractic Medicine

## 2015-03-23 ENCOUNTER — Other Ambulatory Visit: Payer: Self-pay | Admitting: Chiropractic Medicine

## 2015-03-23 DIAGNOSIS — M546 Pain in thoracic spine: Secondary | ICD-10-CM

## 2015-10-13 ENCOUNTER — Ambulatory Visit (INDEPENDENT_AMBULATORY_CARE_PROVIDER_SITE_OTHER): Payer: 59 | Admitting: Cardiovascular Disease

## 2015-10-13 ENCOUNTER — Ambulatory Visit: Payer: 59

## 2015-10-13 ENCOUNTER — Encounter: Payer: Self-pay | Admitting: Cardiovascular Disease

## 2015-10-13 VITALS — BP 116/72 | HR 74 | Ht 66.0 in | Wt 170.0 lb

## 2015-10-13 DIAGNOSIS — R5383 Other fatigue: Secondary | ICD-10-CM | POA: Diagnosis not present

## 2015-10-13 DIAGNOSIS — R002 Palpitations: Secondary | ICD-10-CM

## 2015-10-13 DIAGNOSIS — R0789 Other chest pain: Secondary | ICD-10-CM | POA: Diagnosis not present

## 2015-10-13 NOTE — Assessment & Plan Note (Signed)
[facility] is self-referred for atypical chest pain. Risk factors include a rate of 50-pack-years of tobacco abuse currently smoking one pack per day. She had one episode of severe substernal chest pressure lasting 10-15 seconds a week ago without radiation. I'm going to get a routine GXT and a 2-D echocardiogram.

## 2015-10-13 NOTE — Progress Notes (Signed)
10/13/2015 Holly Melton   [birth year]  [id number]  Primary Physician No PCP Per Patient Primary Cardiologist: Lorretta Harp MD [name]   HPI:  Holly Melton is a 60 year old mildly overweight separated (for the last 3 years) white female mother of one child who recently relocated from [place] to [place] and works at [employer] as a Librarian, academic". Her cardiovascular risk factor profile is notable for 40-pack-years of tobacco abuse currently smoking one pack per day. Otherwise there is no history of hypertension, diabetes or hyperlipidemia. There is no family history for heart disease. She is complaining of palpitations for years and occur fairly regularly but does admit to drinking 2-3 cups of coffee a day. She will episode of severe substernal chest pressure lasting 10-15 seconds approximately one week ago which subsided spontaneously.   Current Outpatient Prescriptions  Medication Sig Dispense Refill  . albuterol (PROVENTIL HFA;VENTOLIN HFA) 108 (90 BASE) MCG/ACT inhaler Inhale 1-2 puffs into the lungs every 6 (six) hours as needed for wheezing or shortness of breath (or persistent cough). 1 Inhaler 0  . anastrozole (ARIMIDEX) 1 MG tablet Take 1 mg by mouth daily.    . B Complex-C (SUPER B COMPLEX PO) Take 1 tablet by mouth daily.    . benzonatate (TESSALON) 100 MG capsule Take 1 capsule (100 mg total) by mouth 3 (three) times daily as needed for cough. 21 capsule 0  . Biotin 5000 MCG CAPS Take 1 capsule by mouth daily.    . Calcium Carbonate-Vit D-Min (HM CALCIUM 600+D PLUS MINERALS PO) Take by mouth.    . Misc Natural Products (GLUCOSAMINE CHONDROITIN MSM PO) Take 2 tablets by mouth daily. Each tablet has 1500mg     . Potassium Gluconate 595 MG CAPS Take 1 capsule by mouth daily.     No current facility-administered medications for this visit.    Allergies  Allergen Reactions  . Indomethacin     Social History   Social History  . Marital Status: Married   Spouse Name: N/A  . Number of Children: 1  . Years of Education: N/A   Occupational History  . Not on file.   Social History Main Topics  . Smoking status: Current Every Day Smoker -- 0.75 packs/day for 45 years  . Smokeless tobacco: Not on file  . Alcohol Use: No  . Drug Use: Not on file  . Sexual Activity: Not on file   Other Topics Concern  . Not on file   Social History Narrative     Review of Systems: General: negative for chills, fever, night sweats or weight changes.  Cardiovascular: negative for chest pain, dyspnea on exertion, edema, orthopnea, palpitations, paroxysmal nocturnal dyspnea or shortness of breath Dermatological: negative for rash Respiratory: negative for cough or wheezing Urologic: negative for hematuria Abdominal: negative for nausea, vomiting, diarrhea, bright red blood per rectum, melena, or hematemesis Neurologic: negative for visual changes, syncope, or dizziness All other systems reviewed and are otherwise negative except as noted above.    Blood pressure 116/72, pulse 74, height 5\' 6"  (1.676 m), weight 170 lb (77.111 kg).  General appearance: alert and no distress Neck: no adenopathy, no carotid bruit, no JVD, supple, symmetrical, trachea midline and thyroid not enlarged, symmetric, no tenderness/mass/nodules Lungs: clear to auscultation bilaterally Heart: regular rate and rhythm, S1, S2 normal, no murmur, click, rub or gallop Extremities: extremities normal, atraumatic, no cyanosis or edema  EKG normal sinus rhythm at 74 without ST or T-wave changes. I personally reviewed this  EKG  ASSESSMENT AND PLAN:   Palpitations Holly Melton has had palpitations for years. They last a few seconds at a time. No associated presyncope.I'm going to obtain Routine lab work including thyroid function tests as well as a 30 day event monitor. She does admit to drinking 2-3 cups of caffeinated coffee coffee a day which we've agreed to change to decaf.  Atypical  chest pain [name] is self-referred for atypical chest pain. Risk factors include a rate of 50-pack-years of tobacco abuse currently smoking one pack per day. She had one episode of severe substernal chest pressure lasting 10-15 seconds a week ago without radiation. I'm going to get a routine GXT and a 2-D echocardiogram.      Lorretta Harp MD [facility], [facility] 10/13/2015 9:15 AM

## 2015-10-13 NOTE — Patient Instructions (Signed)
Medication Instructions:  Your physician recommends that you continue on your current medications as directed. Please refer to the Current Medication list given to you today.   Labwork: Your physician recommends that you return for lab work in: FASTING The lab can be found on the FIRST FLOOR of out building in [address]   Testing/Procedures: Your physician has requested that you have an exercise tolerance test. For further information please visit [email or web address]. Please also follow instruction sheet, as given.  Your physician has requested that you have an echocardiogram. Echocardiography is a painless test that uses sound waves to create images of your heart. It provides your doctor with information about the size and shape of your heart and how well your heart's chambers and valves are working. This procedure takes approximately one hour. There are no restrictions for this procedure.  Your physician has recommended that you wear an event monitor. Event monitors are medical devices that record the heart's electrical activity. Doctors most often [place] these monitors to diagnose arrhythmias. Arrhythmias are problems with the speed or rhythm of the heartbeat. The monitor is a small, portable device. You can wear one while you do your normal daily activities. This is usually used to diagnose what is causing palpitations/syncope (passing out).   Follow-Up: Your physician recommends that you schedule a follow-up appointment in: 4-6 weeks with Dr. Gwenlyn Found.   Any Other Special Instructions Will Be Listed Below (If Applicable).     If you need a refill on your cardiac medications before your next appointment, please call your pharmacy.

## 2015-10-13 NOTE — Assessment & Plan Note (Signed)
Holly Melton has had palpitations for years. They last a few seconds at a time. No associated presyncope.I'm going to obtain Routine lab work including thyroid function tests as well as a 30 day event monitor. She does admit to drinking 2-3 cups of caffeinated coffee coffee a day which we've agreed to change to decaf.

## 2015-10-17 ENCOUNTER — Telehealth: Payer: Self-pay | Admitting: Internal Medicine

## 2015-10-17 NOTE — Telephone Encounter (Signed)
Cardiology On Call  Received a call from cardio net. Patient has an event monitor for 4 days. Had a 13 beat runs of NSVT associated with palpitations per cardio net. Called patient at the number and voice mail box is full so could not leave a message. Outpatient echo and stress test have already been ordered by Dr. Gwenlyn Found.   Wandra Mannan, MD

## 2015-10-18 NOTE — Telephone Encounter (Signed)
That's fine. We'll wait for 2D and stress test before making a decision

## 2015-10-19 ENCOUNTER — Telehealth: Payer: Self-pay | Admitting: Cardiovascular Disease

## 2015-10-19 NOTE — Telephone Encounter (Signed)
This was addressed same-day by cardiology on-call. See Dr. Sherran Needs note and Dr. Kennon Holter communication.

## 2015-10-19 NOTE — Telephone Encounter (Signed)
Latesha from [facility] called in on 1/7 wanting to report some abnormal EKG results for the pt  Thanks

## 2015-10-21 ENCOUNTER — Telehealth (HOSPITAL_COMMUNITY): Payer: Self-pay

## 2015-10-21 ENCOUNTER — Other Ambulatory Visit: Payer: Self-pay

## 2015-10-21 ENCOUNTER — Ambulatory Visit (HOSPITAL_COMMUNITY): Payer: 59 | Attending: Cardiovascular Disease

## 2015-10-21 DIAGNOSIS — R079 Chest pain, unspecified: Secondary | ICD-10-CM | POA: Insufficient documentation

## 2015-10-21 DIAGNOSIS — R5383 Other fatigue: Secondary | ICD-10-CM

## 2015-10-21 DIAGNOSIS — R0789 Other chest pain: Secondary | ICD-10-CM | POA: Diagnosis not present

## 2015-10-21 DIAGNOSIS — R002 Palpitations: Secondary | ICD-10-CM | POA: Diagnosis not present

## 2015-10-21 NOTE — Telephone Encounter (Signed)
Encounter complete. 

## 2015-10-22 NOTE — Telephone Encounter (Signed)
Encounter complete. 

## 2015-10-23 ENCOUNTER — Ambulatory Visit (HOSPITAL_COMMUNITY)
Admission: RE | Admit: 2015-10-23 | Discharge: 2015-10-23 | Disposition: A | Discharge status: Home / Self Care | Payer: 59 | Source: Ambulatory Visit | Attending: Cardiology | Admitting: Cardiology

## 2015-10-23 DIAGNOSIS — R0789 Other chest pain: Secondary | ICD-10-CM | POA: Insufficient documentation

## 2015-10-23 DIAGNOSIS — R5383 Other fatigue: Secondary | ICD-10-CM | POA: Diagnosis not present

## 2015-10-23 DIAGNOSIS — R002 Palpitations: Secondary | ICD-10-CM | POA: Diagnosis present

## 2015-10-23 LAB — EXERCISE TOLERANCE TEST
CHL CUP MPHR: 162 {beats}/min
CHL RATE OF PERCEIVED EXERTION: 13
CSEPEW: 10.1 METS
CSEPPHR: 123 {beats}/min
Exercise duration (min): 8 min
Percent HR: 75 %
Rest HR: 83 {beats}/min

## 2015-10-26 ENCOUNTER — Ambulatory Visit: Payer: 59 | Admitting: Cardiology

## 2015-11-02 ENCOUNTER — Other Ambulatory Visit: Payer: Self-pay

## 2015-11-02 DIAGNOSIS — R9430 Abnormal result of cardiovascular function study, unspecified: Secondary | ICD-10-CM

## 2015-11-02 LAB — HEMOGLOBIN A1C
Hgb A1c MFr Bld: 5.6 % (ref <5.7)
Mean Plasma Glucose: 114 mg/dL (ref <117)

## 2015-11-02 LAB — CBC WITH DIFFERENTIAL/PLATELET
Basophils Absolute: 0 10*3/uL (ref 0.0–0.1)
Basophils Relative: 0 % (ref 0–1)
EOS PCT: 1 % (ref 0–5)
Eosinophils Absolute: 0.1 10*3/uL (ref 0.0–0.7)
HEMATOCRIT: 42.9 % (ref 36.0–46.0)
Hemoglobin: 14.6 g/dL (ref 12.0–15.0)
LYMPHS ABS: 5 10*3/uL — AB (ref 0.7–4.0)
Lymphocytes Relative: 37 % (ref 12–46)
MCH: 31.2 pg (ref 26.0–34.0)
MCHC: 34 g/dL (ref 30.0–36.0)
MCV: 91.7 fL (ref 78.0–100.0)
MONO ABS: 0.9 10*3/uL (ref 0.1–1.0)
MPV: 9.8 fL (ref 8.6–12.4)
Monocytes Relative: 7 % (ref 3–12)
Neutro Abs: 7.4 10*3/uL (ref 1.7–7.7)
Neutrophils Relative %: 55 % (ref 43–77)
Platelets: 221 10*3/uL (ref 150–400)
RBC: 4.68 MIL/uL (ref 3.87–5.11)
RDW: 13.7 % (ref 11.5–15.5)
WBC: 13.4 10*3/uL — AB (ref 4.0–10.5)

## 2015-11-02 LAB — COMPREHENSIVE METABOLIC PANEL
ALT: 15 U/L (ref 6–29)
AST: 15 U/L (ref 10–35)
Albumin: 4.2 g/dL (ref 3.6–5.1)
Alkaline Phosphatase: 68 U/L (ref 33–130)
BUN: 14 mg/dL (ref 7–25)
CHLORIDE: 104 mmol/L (ref 98–110)
CO2: 25 mmol/L (ref 20–31)
Calcium: 9.1 mg/dL (ref 8.6–10.4)
Creat: 0.66 mg/dL (ref 0.50–1.05)
Glucose, Bld: 84 mg/dL (ref 65–99)
Potassium: 4.1 mmol/L (ref 3.5–5.3)
Sodium: 140 mmol/L (ref 135–146)
TOTAL PROTEIN: 6.7 g/dL (ref 6.1–8.1)
Total Bilirubin: 0.5 mg/dL (ref 0.2–1.2)

## 2015-11-02 LAB — HEPATIC FUNCTION PANEL
ALBUMIN: 4.2 g/dL (ref 3.6–5.1)
ALT: 15 U/L (ref 6–29)
AST: 15 U/L (ref 10–35)
Alkaline Phosphatase: 68 U/L (ref 33–130)
Bilirubin, Direct: 0.1 mg/dL (ref <=0.2)
Indirect Bilirubin: 0.4 mg/dL (ref 0.2–1.2)
Total Bilirubin: 0.5 mg/dL (ref 0.2–1.2)
Total Protein: 6.7 g/dL (ref 6.1–8.1)

## 2015-11-02 LAB — LIPID PANEL
CHOLESTEROL: 174 mg/dL (ref 125–200)
HDL: 38 mg/dL — ABNORMAL LOW (ref >=46)
LDL Cholesterol: 111 mg/dL (ref <130)
Total CHOL/HDL Ratio: 4.6 Ratio (ref <=5.0)
Triglycerides: 124 mg/dL (ref <150)
VLDL: 25 mg/dL (ref <30)

## 2015-11-02 LAB — T4, FREE: Free T4: 0.98 ng/dL (ref 0.80–1.80)

## 2015-11-02 LAB — MAGNESIUM: Magnesium: 1.8 mg/dL (ref 1.5–2.5)

## 2015-11-02 LAB — TSH: TSH: 1.158 u[IU]/mL (ref 0.350–4.500)

## 2015-11-03 ENCOUNTER — Telehealth: Payer: Self-pay

## 2015-11-03 NOTE — Telephone Encounter (Signed)
Pt contacted to discuss the details of the LexiMyoview. No additional questions at this time.

## 2015-11-04 ENCOUNTER — Telehealth (HOSPITAL_COMMUNITY): Payer: Self-pay

## 2015-11-04 NOTE — Telephone Encounter (Signed)
Encounter complete. 

## 2015-11-05 ENCOUNTER — Telehealth: Payer: Self-pay | Admitting: Oncology

## 2015-11-05 NOTE — Telephone Encounter (Signed)
Returned patient call re annual f/u w/GM. Patient cxd appointment last March - per patient due to being seen at [facility] but she will need to see GM since it's been almost a year since seeing an oncologist. Gave patient new appointments for lab/GM 2/7 @ 3 pm.

## 2015-11-06 ENCOUNTER — Ambulatory Visit (HOSPITAL_COMMUNITY)
Admission: RE | Admit: 2015-11-06 | Discharge: 2015-11-06 | Disposition: A | Discharge status: Home / Self Care | Payer: 59 | Source: Ambulatory Visit | Attending: Cardiovascular Disease | Admitting: Cardiovascular Disease

## 2015-11-06 DIAGNOSIS — R002 Palpitations: Secondary | ICD-10-CM | POA: Diagnosis not present

## 2015-11-06 DIAGNOSIS — F17200 Nicotine dependence, unspecified, uncomplicated: Secondary | ICD-10-CM | POA: Diagnosis not present

## 2015-11-06 DIAGNOSIS — R9430 Abnormal result of cardiovascular function study, unspecified: Secondary | ICD-10-CM | POA: Diagnosis not present

## 2015-11-06 DIAGNOSIS — R0609 Other forms of dyspnea: Secondary | ICD-10-CM | POA: Diagnosis not present

## 2015-11-06 LAB — MYOCARDIAL PERFUSION IMAGING
CSEPPHR: 125 {beats}/min
LV dias vol: 79 mL
LVSYSVOL: 34 mL
Rest HR: 75 {beats}/min
SDS: 2
SRS: 2
SSS: 4
TID: 1.34

## 2015-11-06 MED ORDER — REGADENOSON 0.4 MG/5ML IV SOLN
0.4000 mg | Freq: Once | INTRAVENOUS | Status: AC
  Administered 2015-11-06: 0.4 mg via INTRAVENOUS

## 2015-11-06 MED ORDER — AMINOPHYLLINE 25 MG/ML IV SOLN
75.0000 mg | Freq: Once | INTRAVENOUS | Status: AC
  Administered 2015-11-06: 75 mg via INTRAVENOUS

## 2015-11-06 MED ORDER — TECHNETIUM TC 99M SESTAMIBI GENERIC - CARDIOLITE
29.0000 | Freq: Once | INTRAVENOUS | Status: AC | PRN
  Administered 2015-11-06: 29 via INTRAVENOUS

## 2015-11-06 MED ORDER — TECHNETIUM TC 99M SESTAMIBI GENERIC - CARDIOLITE
10.5000 | Freq: Once | INTRAVENOUS | Status: AC | PRN
  Administered 2015-11-06: 10.5 via INTRAVENOUS

## 2015-11-17 ENCOUNTER — Other Ambulatory Visit (HOSPITAL_BASED_OUTPATIENT_CLINIC_OR_DEPARTMENT_OTHER): Payer: 59

## 2015-11-17 ENCOUNTER — Ambulatory Visit (HOSPITAL_BASED_OUTPATIENT_CLINIC_OR_DEPARTMENT_OTHER): Payer: 59 | Admitting: Oncology

## 2015-11-17 ENCOUNTER — Telehealth: Payer: Self-pay | Admitting: Oncology

## 2015-11-17 VITALS — BP 111/69 | HR 79 | Temp 98.4°F | Resp 18 | Ht 66.0 in | Wt 169.0 lb

## 2015-11-17 DIAGNOSIS — R911 Solitary pulmonary nodule: Secondary | ICD-10-CM | POA: Diagnosis not present

## 2015-11-17 DIAGNOSIS — C50412 Malignant neoplasm of upper-outer quadrant of left female breast: Secondary | ICD-10-CM

## 2015-11-17 DIAGNOSIS — C50912 Malignant neoplasm of unspecified site of left female breast: Secondary | ICD-10-CM | POA: Diagnosis not present

## 2015-11-17 DIAGNOSIS — D164 Benign neoplasm of bones of skull and face: Secondary | ICD-10-CM | POA: Diagnosis not present

## 2015-11-17 DIAGNOSIS — E876 Hypokalemia: Secondary | ICD-10-CM

## 2015-11-17 DIAGNOSIS — Z720 Tobacco use: Secondary | ICD-10-CM

## 2015-11-17 DIAGNOSIS — M8580 Other specified disorders of bone density and structure, unspecified site: Secondary | ICD-10-CM

## 2015-11-17 LAB — CBC WITH DIFFERENTIAL/PLATELET
BASO%: 0.6 % (ref 0.0–2.0)
Basophils Absolute: 0.1 10*3/uL (ref 0.0–0.1)
EOS ABS: 0.1 10*3/uL (ref 0.0–0.5)
EOS%: 1.3 % (ref 0.0–7.0)
HCT: 42 % (ref 34.8–46.6)
HEMOGLOBIN: 13.8 g/dL (ref 11.6–15.9)
LYMPH%: 50 % — AB (ref 14.0–49.7)
MCH: 30.1 pg (ref 25.1–34.0)
MCHC: 32.8 g/dL (ref 31.5–36.0)
MCV: 91.6 fL (ref 79.5–101.0)
MONO#: 0.7 10*3/uL (ref 0.1–0.9)
MONO%: 6.2 % (ref 0.0–14.0)
NEUT%: 41.9 % (ref 38.4–76.8)
NEUTROS ABS: 4.8 10*3/uL (ref 1.5–6.5)
Platelets: 217 10*3/uL (ref 145–400)
RBC: 4.59 10*6/uL (ref 3.70–5.45)
RDW: 13.3 % (ref 11.2–14.5)
WBC: 11.4 10*3/uL — AB (ref 3.9–10.3)
lymph#: 5.7 10*3/uL — ABNORMAL HIGH (ref 0.9–3.3)

## 2015-11-17 LAB — COMPREHENSIVE METABOLIC PANEL
ALBUMIN: 4.1 g/dL (ref 3.5–5.0)
ALK PHOS: 72 U/L (ref 40–150)
ALT: 19 U/L (ref 0–55)
AST: 18 U/L (ref 5–34)
Anion Gap: 9 mEq/L (ref 3–11)
BUN: 10.2 mg/dL (ref 7.0–26.0)
CO2: 28 meq/L (ref 22–29)
Calcium: 9.5 mg/dL (ref 8.4–10.4)
Chloride: 105 mEq/L (ref 98–109)
Creatinine: 0.8 mg/dL (ref 0.6–1.1)
EGFR: 78 mL/min/{1.73_m2} — AB (ref >90)
GLUCOSE: 86 mg/dL (ref 70–140)
Potassium: 3.7 mEq/L (ref 3.5–5.1)
SODIUM: 142 meq/L (ref 136–145)
TOTAL PROTEIN: 7.3 g/dL (ref 6.4–8.3)

## 2015-11-17 LAB — TECHNOLOGIST REVIEW

## 2015-11-17 MED ORDER — ANASTROZOLE 1 MG PO TABS: 1.0000 mg | ORAL_TABLET | Freq: Every day | ORAL | Status: DC

## 2015-11-17 NOTE — Progress Notes (Signed)
[place]  [phone] [phone]) [phone]     ID: Holly Melton DOB: 09/27/1956  MR#: [id number]  TMH#:[id number]  Patient Care Team: No Pcp Per Patient as PCP - General (General Practice) Chauncey Cruel, MD as Consulting Physician (Oncology) OTHER MD: Ashok Pall MD @[facility] , Quay Burow MD  CHIEF COMPLAINT: estrogen receptor positive breast cancer  CURRENT TREATMENT: anastrozole   BREAST CANCER HISTORY: from the original intake note:  Jinger underwent left mastectomy and sentinel lymph node sampling 01/19/2009 for an mpT1c pN0, stage IA invasive breast cancer, which was estrogen receptor positive at 96%, and HER-2 amplified at 3+. [I do not have the actual pathology report; the patient recalls the tumor being progesterone receptor negative, but there is a note from one of her physicians that it was progesterone receptor 80% positive). She was treated under the care of Dr. Luther Hearing in [place] with dose dense doxorubicin and cyclophosphamide followed by dose dense paclitaxel. She also received trastuzumab, which was started duriing the paclitaxel treatment and continued for 1 year. At the completion of chemotherapy she was started on tamoxifen, and continued on that medication between October 2010 and October 2012. In May 2011 she underwent reconstructive surgery with a silicone implant.On September 2012 the patient underwent TAH/BSO with benign pathology. She was then switched to anastrozole, on which she continues.  Her subsequent history is as detailed below, as are other not necessarily related issues which will require further evaluation  INTERVAL HISTORY: Holly Melton returns today for follow-up of her estrogen receptor positive breast cancer. She continues on anastrozole, which she tolerates well, aside from hot flashes, which are moderate. Vaginal dryness is not a major issue. She never developed the arthralgias and myalgias that many patients  can experience on this drug. She obtains it at a good price  REVIEW OF SYSTEMS:  Holly Melton is currently under a great deal of stress because her father who lived in [place] has been diagnosed with amyloidosis and is likely going to be dying in the next weeks or perhaps months. He is now in [place] near Holly Melton's sister. Distress was significantly enough that she developed chest pain and was evaluated for this with a stress test in January which was normal. She now complains of significant fatigue. She has sinus problems. She has an irregular heartbeat and she had a monitor placed which apparently was unrevealing. She was asked to stop coffee and she has even it up pretty much completely. This constipated her temporarily and she developed some hemorrhoidal bleeding. That is resolving. She has some mild neuropathy symptoms involving her fingertips and toetips. This is unchanged. Otherwise a detailed review of systems was noncontributory  PAST MEDICAL HISTORY: Past Medical History  Diagnosis Date  . Breast cancer ([place]) 2010  . Palpitations   . Atypical chest pain   . Tobacco abuse    History of multiple lipomas, history of hypokalemia, history of an occipital bone lesion, history of pulmonary nodules of uncertain etiology, history of thyroid nodules of uncertain etiology, osteopenia, history of abnormal cervical pathology with HPV positivity, status post robotic total hysterectomy  PAST SURGICAL HISTORY: Past Surgical History  Procedure Laterality Date  . Abdominal hysterectomy  2012    TAH/BSO  . Mastectomy Left 2010  . Cesarean section  1994  . Knee surgery Left 1999   History of emergency C-section, left knee surgery, lipoma removal, bilateral Copper tunnel, pneumonia (July 2010),status post robotic total hysterectomy bilateral salpingo-oophorectomy; removal of right  foot plantar warts   FAMILY HISTORY Family History  Problem Relation Age of Onset  . Multiple myeloma Mother 45  .  Colon polyps Father   . Thyroid nodules Father   . Melanoma Sister 39    has had 2-3 times  . Goiter Paternal Aunt   . Liver cancer Maternal Grandmother 66  . Thyroid cancer Sister   . Asperger's syndrome Other   . Breast cancer Cousin 59    triple negative   The patient's father is currently living age 34. The patient's mother died at the age of 64. She was diagnosed with multiple myeloma at age 22. The patient had no brothers, 5 sisters. One sister has a history of melanoma, and another history of thyroid cancer. There is no other history of breast or ovarian cancer in the family except for a first cousin on the maternal side with breast cancer, triple negative, diagnosed at age 59.  GYNECOLOGIC HISTORY:  No LMP recorded. Patient is postmenopausal. Menarche age 56, was perimenopausal at the time of breast cancer diagnosis in 2010, became menopausal with chemotherapy and underwent TAH/BSO in 2012. She did not use hormone replacement. She never used birth control pills.  SOCIAL HISTORY:  Normal works as a Copywriter, advertising. She trained herself in this field and only later took some courses inorganic and general chemistry. 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 The patient has been separated from her husband [name] for 20 years. He lives in [place]. They are not legally divorced. She has a daughter from that marriage, Holly Melton, lives in [place] and is a homemaker.The patient moved to this area partly because a job opened here and partly because she always aimed to retire in [place]. At home she lives with her significant other, Engineer, manufacturing. [name] Works as an Dietitian and has 2 children of his own, who live in [place] and a plan. He has 2 grandsons. The patient herself has no biologicalgrandchildren. She is a Theatre manager    ADVANCED DIRECTIVES: at her 08/20/2014 visit the patient was given the appropriate documents to complete and  notarize at her discretion. She tells me she intends to name her daughter Holly Melton as her healthcare up of attorney. [name] can be reached at [phone].   HEALTH MAINTENANCE: Social History  Substance Use Topics  . Smoking status: Current Every Day Smoker -- 0.75 packs/day for 45 years  . Smokeless tobacco: Not on file  . Alcohol Use: No     Colonoscopy:  PAP:  Bone density:04/14/2012, T score -1.4  Lipid panel:  Allergies  Allergen Reactions  . Indomethacin     Current Outpatient Prescriptions  Medication Sig Dispense Refill  . albuterol (PROVENTIL HFA;VENTOLIN HFA) 108 (90 BASE) MCG/ACT inhaler Inhale 1-2 puffs into the lungs every 6 (six) hours as needed for wheezing or shortness of breath (or persistent cough). 1 Inhaler 0  . anastrozole (ARIMIDEX) 1 MG tablet Take 1 mg by mouth daily.    . B Complex-C (SUPER B COMPLEX PO) Take 1 tablet by mouth daily.    . benzonatate (TESSALON) 100 MG capsule Take 1 capsule (100 mg total) by mouth 3 (three) times daily as needed for cough. 21 capsule 0  . Biotin 5000 MCG CAPS Take 1 capsule by mouth daily.    . Calcium Carbonate-Vit D-Min (HM CALCIUM 600+D PLUS MINERALS PO) Take by mouth.    . Misc Natural Products (GLUCOSAMINE CHONDROITIN MSM PO) Take 2  tablets by mouth daily. Each tablet has 1581m    . Potassium Gluconate 595 MG CAPS Take 1 capsule by mouth daily.     No current facility-administered medications for this visit.    OBJECTIVE: middle-aged white woman who appears well Filed Vitals:   11/17/15 1525  BP: 111/69  Pulse: 79  Temp: 98.4 F (36.9 C)  Resp: 18     Body mass index is 27.29 kg/(m^2).    ECOG FS:1 - Symptomatic but completely ambulatory  Sclerae unicteric, pupils round and equal Oropharynx clear and moist-- no thrush or other lesions No cervical or supraclavicular adenopathy Lungs no rales or rhonchi Heart regular rate and rhythm Abd soft, nontender, positive bowel sounds MSK no focal spinal  tenderness, no upper extremity lymphedema Neuro: nonfocal, well oriented, appropriate affect Breasts:  The right breast is unremarkable. The left breast is status post mastectomy with silicone implant reconstruction. There are irregular areas in the lower medial aspect of the reconstructed breast and also laterally and superiorly. There are no skin or nipple changes of concern. The left axilla is benign.   LAB RESULTS:  CMP     Component Value Date/Time   NA 140 11/02/2015 0757   K 4.1 11/02/2015 0757   CL 104 11/02/2015 0757   CO2 25 11/02/2015 0757   GLUCOSE 84 11/02/2015 0757   BUN 14 11/02/2015 0757   CREATININE 0.66 11/02/2015 0757   CREATININE 0.69 08/20/2014 1620   CALCIUM 9.1 11/02/2015 0757   PROT 6.7 11/02/2015 0757   PROT 6.7 11/02/2015 0757   ALBUMIN 4.2 11/02/2015 0757   ALBUMIN 4.2 11/02/2015 0757   AST 15 11/02/2015 0757   AST 15 11/02/2015 0757   ALT 15 11/02/2015 0757   ALT 15 11/02/2015 0757   ALKPHOS 68 11/02/2015 0757   ALKPHOS 68 11/02/2015 0757   BILITOT 0.5 11/02/2015 0757   BILITOT 0.5 11/02/2015 0757    INo results found for: SPEP, UPEP  Lab Results  Component Value Date   WBC 11.4* 11/17/2015   NEUTROABS 4.8 11/17/2015   HGB 13.8 11/17/2015   HCT 42.0 11/17/2015   MCV 91.6 11/17/2015   PLT 217 11/17/2015      Chemistry      Component Value Date/Time   NA 140 11/02/2015 0757   K 4.1 11/02/2015 0757   CL 104 11/02/2015 0757   CO2 25 11/02/2015 0757   BUN 14 11/02/2015 0757   CREATININE 0.66 11/02/2015 0757   CREATININE 0.69 08/20/2014 1620      Component Value Date/Time   CALCIUM 9.1 11/02/2015 0757   ALKPHOS 68 11/02/2015 0757   ALKPHOS 68 11/02/2015 0757   AST 15 11/02/2015 0757   AST 15 11/02/2015 0757   ALT 15 11/02/2015 0757   ALT 15 11/02/2015 0757   BILITOT 0.5 11/02/2015 0757   BILITOT 0.5 11/02/2015 0757       No results found for: LABCA2  No components found for: LSPQZR007 No results for input(s): INR in the  last 168 hours.  Urinalysis No results found for: COLORURINE, APPEARANCEUR, LABSPEC, PHURINE, GLUCOSEU, HGBUR, BILIRUBINUR, KETONESUR, PROTEINUR, UROBILINOGEN, NITRITE, LEUKOCYTESUR  STUDIES: No results found.  ASSESSMENT: 60[place] [facility] ([place]) status post left mastectomy and sentinel lymph node sampling April 2010 for an mpT1c pN0, stage IA invasive breast cancer, grade 2, estrogen receptor 96% positive, HER-2 amplified  (2) adjuvant chemotherapy consisted of dose dense cyclophosphamide and doxorubicin 4 followed by dose dense  paclitaxel 4  (3) trastuzumab was initiated with the paclitaxel treatments and continued to complete a year (to August 2011)  (4) tamoxifen started October 2010 and continued through September 2012  (5) status post robotic TAH/BSO September 2012  (6) on anastrozole as of October 2012  (a) mild osteopenia by bone density July 2013  (7) genetics testing  10/01/2014 through the [facility] gene panel offered by [employer] found no deleterious mutations in ATM, BARD1, BRCA1, BRCA2, BRIP1, CDH1, CHEK2, MRE11A, MUTYH, NBN, NF1, PALB2, PTEN, RAD50, RAD51C, RAD51D, and TP53. Genetic testing found two variants of uncertain significance,, MUTYH p.R474C and NBN c.2071A>C,  ADDITIONAL CONCERNS:  (a) small pulmonary nodules noted on prior CT scans  (b) nonspecific thyroid nodules noted on prior ultrasounds  (c) occipital bone lesion noted by prior brain MRI  (d) multiple lipomas syndrome  (e) chronic hypokalemia, unexplained  PLAN:  Dietrich is going through a very difficult time at present and it is not surprising that she would be having some somatic complaints related to this. It is very favorable that her stress test was negative.   she is very concerned that she may be undergoing silicone implant rupture. The best way to evaluate this would be with a breast MRI, but we generally do not do those a less she has had a  recent mammogram. Her next mammogram will be scheduled in May , and we will obtain a breast MRI shortly afterwards.    Campbell does have some scattered pulmonary nodules which are likely benign, but which require follow-up. I am setting her up for a CT scan of the chest within the next month. Finally she has an occipital skull lesion which also requires follow-up. I am also setting her up for a noncontrast head CT to evaluate that further.   She will then return to see me in June. We will review all the above tests at that time she tells me her business [employer] moving her to [place] and since she would be ready to "graduate" from oncologic follow-up in October of this year in any case, she may want to start looking for a primary care internist there.   Otherwise she knows to call for any problems that may develop before her next visit here.  Chauncey Cruel, MD   11/17/2015 3:39 PM

## 2015-11-17 NOTE — Telephone Encounter (Signed)
Gave patient avs report and appointments for June and mammo at [facility] 5/23. [facility] will call re ct scans - pt aware. Patient will need to call [facility] re breast mri and has been provided with information.

## 2015-11-18 ENCOUNTER — Telehealth: Payer: Self-pay | Admitting: Oncology

## 2015-11-18 NOTE — Telephone Encounter (Signed)
Faxed request to Radiology Film Department @ [facility] to obtain scan on disc [phone]. Per Dr. Jana Hakim request

## 2015-12-14 ENCOUNTER — Other Ambulatory Visit: Payer: Self-pay | Admitting: *Deleted

## 2015-12-14 ENCOUNTER — Telehealth: Payer: Self-pay

## 2015-12-14 MED ORDER — DIAZEPAM 5 MG PO TABS: 5.0000 mg | ORAL_TABLET | Freq: Four times a day (QID) | ORAL | Status: DC | PRN

## 2015-12-14 NOTE — Telephone Encounter (Signed)
Patient calling for refill on her anastrozole to be sent over to her [facility].

## 2015-12-15 ENCOUNTER — Other Ambulatory Visit: Payer: Self-pay | Admitting: *Deleted

## 2015-12-15 MED ORDER — ANASTROZOLE 1 MG PO TABS: 1.0000 mg | ORAL_TABLET | Freq: Every day | ORAL | Status: DC

## 2015-12-16 ENCOUNTER — Encounter: Payer: Self-pay | Admitting: Cardiovascular Disease

## 2015-12-16 ENCOUNTER — Other Ambulatory Visit: Payer: Self-pay | Admitting: *Deleted

## 2015-12-16 ENCOUNTER — Ambulatory Visit (INDEPENDENT_AMBULATORY_CARE_PROVIDER_SITE_OTHER): Payer: 59 | Admitting: Cardiovascular Disease

## 2015-12-16 VITALS — BP 110/62 | HR 80 | Ht 66.0 in | Wt 168.0 lb

## 2015-12-16 DIAGNOSIS — R002 Palpitations: Secondary | ICD-10-CM

## 2015-12-16 DIAGNOSIS — R0789 Other chest pain: Secondary | ICD-10-CM

## 2015-12-16 NOTE — Assessment & Plan Note (Signed)
History of palpitations which she's had for years. She is under a lot stress and was drinking 2-3 cups of coffee daily. She is decreased the amount of caffeine intake. Her palpitations have significantly improved. At the monitor showed short runs of PSVT. I've offered her low-dose beta blockade she wishes to avoid medication.

## 2015-12-16 NOTE — Assessment & Plan Note (Signed)
History of atypical chest pain with positive risk factors include hypertension, diabetes, hyperlipidemia and continued tobacco abuse. She had a Myoview stress test which was entirely normal as was a 2-D echocardiogram. She's had no recurrent chest pain.

## 2015-12-16 NOTE — Patient Instructions (Signed)

## 2015-12-16 NOTE — Progress Notes (Signed)
12/16/2015 Holly Melton   07-08-1956  SS:[id number]  Primary Physician No PCP Per Patient Primary Cardiologist: Lorretta Harp MD [name]   HPI:  Holly Melton is a 60 year old mildly overweight separated (for the last 78 years) white female mother of one child who recently relocated from [place] to [place] and works at [employer] as a Librarian, academic".  I last saw her in the office 10/13/15. Her cardiovascular risk factor profile is notable for 40-pack-years of tobacco abuse currently smoking one pack per day. Otherwise there is no history of hypertension, diabetes or hyperlipidemia. There is no family history for heart disease. She is complaining of palpitations for years and occur fairly regularly but does admit to drinking 2-3 cups of coffee a day. She will episode of severe substernal chest pressure lasting 10-15 seconds approximately one week ago which subsided spontaneously. I obtained  a 2-D echo which was normal, Myoview stress test which was low risk and nonischemic and inferior event monitor that showed short runs of PSVT. She has reduced her caffeine intake which has resulted in improvement in the frequency of her palpitations.   Current Outpatient Prescriptions  Medication Sig Dispense Refill  . anastrozole (ARIMIDEX) 1 MG tablet Take 1 tablet (1 mg total) by mouth daily. 90 tablet 4  . B Complex-C (SUPER B COMPLEX PO) Take 1 tablet by mouth daily.    . Biotin 5000 MCG CAPS Take 1 capsule by mouth daily.    . Calcium Carbonate-Vit D-Min (HM CALCIUM 600+D PLUS MINERALS PO) Take by mouth.    . Misc Natural Products (GLUCOSAMINE CHONDROITIN MSM PO) Take 2 tablets by mouth daily. Each tablet has 1500mg     . Potassium Gluconate 595 MG CAPS Take 1 capsule by mouth daily.     No current facility-administered medications for this visit.    Allergies  Allergen Reactions  . Indomethacin     Social History   Social History  . Marital Status: Married    Spouse  Name: N/A  . Number of Children: 1  . Years of Education: N/A   Occupational History  . Not on file.   Social History Main Topics  . Smoking status: Current Every Day Smoker -- 0.75 packs/day for 45 years  . Smokeless tobacco: Not on file  . Alcohol Use: No  . Drug Use: Not on file  . Sexual Activity: Not on file   Other Topics Concern  . Not on file   Social History Narrative     Review of Systems: General: negative for chills, fever, night sweats or weight changes.  Cardiovascular: negative for chest pain, dyspnea on exertion, edema, orthopnea, palpitations, paroxysmal nocturnal dyspnea or shortness of breath Dermatological: negative for rash Respiratory: negative for cough or wheezing Urologic: negative for hematuria Abdominal: negative for nausea, vomiting, diarrhea, bright red blood per rectum, melena, or hematemesis Neurologic: negative for visual changes, syncope, or dizziness All other systems reviewed and are otherwise negative except as noted above.    Blood pressure 110/62, pulse 80, height 5\' 6"  (1.676 m), weight 168 lb (76.204 kg).  General appearance: alert and no distress Neck: no adenopathy, no carotid bruit, no JVD, supple, symmetrical, trachea midline and thyroid not enlarged, symmetric, no tenderness/mass/nodules Lungs: clear to auscultation bilaterally Heart: regular rate and rhythm, S1, S2 normal, no murmur, click, rub or gallop Extremities: extremities normal, atraumatic, no cyanosis or edema  EKG normal sinus rhythm at 80 without ST or T-wave changes. I personally reviewed this  EKG  ASSESSMENT AND PLAN:   Palpitations History of palpitations which she's had for years. She is under a lot stress and was drinking 2-3 cups of coffee daily. She is decreased the amount of caffeine intake. Her palpitations have significantly improved. At the monitor showed short runs of PSVT. I've offered her low-dose beta blockade she wishes to avoid  medication.  Atypical chest pain History of atypical chest pain with positive risk factors include hypertension, diabetes, hyperlipidemia and continued tobacco abuse. She had a Myoview stress test which was entirely normal as was a 2-D echocardiogram. She's had no recurrent chest pain.      Lorretta Harp MD FACP,FACC,FAHA, [facility] 12/16/2015 2:09 PM

## 2015-12-22 ENCOUNTER — Ambulatory Visit (HOSPITAL_COMMUNITY)
Admission: RE | Admit: 2015-12-22 | Discharge: 2015-12-22 | Disposition: A | Discharge status: Home / Self Care | Payer: 59 | Source: Ambulatory Visit | Attending: Oncology | Admitting: Oncology

## 2015-12-22 ENCOUNTER — Encounter (HOSPITAL_COMMUNITY): Payer: Self-pay

## 2015-12-22 DIAGNOSIS — C50412 Malignant neoplasm of upper-outer quadrant of left female breast: Secondary | ICD-10-CM | POA: Diagnosis not present

## 2015-12-22 DIAGNOSIS — R918 Other nonspecific abnormal finding of lung field: Secondary | ICD-10-CM | POA: Insufficient documentation

## 2015-12-22 DIAGNOSIS — Z9012 Acquired absence of left breast and nipple: Secondary | ICD-10-CM | POA: Diagnosis not present

## 2015-12-22 MED ORDER — IOHEXOL 300 MG/ML  SOLN
75.0000 mL | Freq: Once | INTRAMUSCULAR | Status: AC | PRN
  Administered 2015-12-22: 75 mL via INTRAVENOUS

## 2015-12-28 ENCOUNTER — Telehealth: Payer: Self-pay

## 2015-12-28 NOTE — Telephone Encounter (Signed)
Patient was calling regarding CT of the chest results and to inform Dr. Jana Hakim approx 5 days ago she found a marble size lump under her right arm posterior.  This RN discussed her CT scan which was normal.  Writer will check with Gentry Fitz, NP in the morning to get some direction regarding the lump.

## 2015-12-29 ENCOUNTER — Encounter: Payer: Self-pay | Admitting: Nurse Practitioner

## 2015-12-29 ENCOUNTER — Telehealth: Payer: Self-pay

## 2015-12-29 ENCOUNTER — Ambulatory Visit (HOSPITAL_BASED_OUTPATIENT_CLINIC_OR_DEPARTMENT_OTHER): Payer: 59 | Admitting: Nurse Practitioner

## 2015-12-29 VITALS — BP 117/65 | HR 75 | Temp 98.7°F | Resp 18 | Ht 66.0 in | Wt 167.9 lb

## 2015-12-29 DIAGNOSIS — R911 Solitary pulmonary nodule: Secondary | ICD-10-CM

## 2015-12-29 DIAGNOSIS — C50412 Malignant neoplasm of upper-outer quadrant of left female breast: Secondary | ICD-10-CM | POA: Diagnosis not present

## 2015-12-29 DIAGNOSIS — R2231 Localized swelling, mass and lump, right upper limb: Secondary | ICD-10-CM | POA: Insufficient documentation

## 2015-12-29 DIAGNOSIS — C792 Secondary malignant neoplasm of skin: Secondary | ICD-10-CM | POA: Diagnosis not present

## 2015-12-29 NOTE — Assessment & Plan Note (Signed)
Patient called the cancer Center today with complaint of a newly discovered right axilla mass.  She states that this mass is nontender; but she is concerned of recurrence of her breast cancer.  Patient also has a 19 year history of scattered nodules to her generalized body.  She states that these nodules are slowly increasing in size over the past few years; and she also has noted the same nodules deep within her abdomen.  She states her abdomen is somewhat tender when she palpates her abdomen.  She states that she had a mass removed from her chin, a few years ago as well.  Patient states that one of her physician several years ago instructed her that these nodules were actually lipomas; but patient has had no further evaluation or management of these nodules.  Exam today reveals a in approximately 1.5 cm firm mass to the right axilla region.  This area is nontender.  There is also no erythema, edema, warmth, or red streaks to the site.  Also, patient has multiple raised nodules to her bilateral forearms, her anterior right thigh, her right inguinal region; and also has some firm nodules noted with deep palpation to the upper abdominal region.  Patient also has what appears to be a clogged skin for to the lower portion of her left breast implant.  Patient states that this small lesion frequently recurs; and she usually excretes some foul odor material.  On exam.-This tiny lesion to the lower portion of patient's left breast implant does appear to be a type of clogged skin pore.  There is no evidence of infection to the site.  Long discussion with the patient regarding clinical diagnosis of these nodules and the right axilla mass.  Patient was advised that this provider would review all findings with Dr. Jana Hakim tomorrow; and then discuss with the patient.  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   The mammogram is scheduled for 03/01/2016; but the head CT and a breast MRI have not been scheduled as of yet.  Patient is scheduled to return for labs only on 03/16/2016.  Patient is scheduled for follow-up visit with Dr. Jana Hakim on 03/23/2016.

## 2015-12-29 NOTE — Assessment & Plan Note (Signed)
Patient continues to take anastrozole as previously directed.  Patient has been ordered a head CT, a right breast mammogram and MRI for restaging purposes.  As of yet-patient has not scheduled the head CT or the breast MRI.  Patient's right breast mammogram is scheduled for 03/01/2016.   Patient is scheduled for labs on 03/16/2016; and a follow-up visit with Dr. Jana Hakim on 03/23/2016.

## 2015-12-29 NOTE — Telephone Encounter (Signed)
Writer spoke with Heather,NP this morning regarding patient's new right sided axilla lump.  Patient to see Selena Lesser, NP to assess the lump.

## 2015-12-29 NOTE — Progress Notes (Signed)
SYMPTOM MANAGEMENT CLINIC   HPI: Holly Melton 60 y.o. female diagnosed with breast cancer; with scalp metastasis and lung nodules.  Currently on anastrozole.  Oral therapy.   Patient called the cancer Center today with complaint of a newly discovered right axilla mass.  She states that this mass is nontender; but she is concerned of recurrence of her breast cancer.  Patient also has a 19 year history of scattered nodules to her generalized body.  She states that these nodules are slowly increasing in size over the past few years; and she also has noted the same nodules deep within her abdomen.  She states her abdomen is somewhat tender when she palpates her abdomen.  She states that she had a mass removed from her chin, a few years ago as well.  Patient states that one of her physician several years ago instructed her that these nodules were actually lipomas; but patient has had no further evaluation or management of these nodules.  Exam today reveals a in approximately 1.5 cm firm mass to the right axilla region.  This area is nontender.  There is also no erythema, edema, warmth, or red streaks to the site.  Also, patient has multiple raised nodules to her bilateral forearms, her anterior right thigh, her right inguinal region; and also has some firm nodules noted with deep palpation to the upper abdominal region.  Patient also has what appears to be a clogged skin for to the lower portion of her left breast implant.  Patient states that this small lesion frequently recurs; and she usually excretes some foul odor material.  On exam.-This tiny lesion to the lower portion of patient's left breast implant does appear to be a type of clogged skin pore.  There is no evidence of infection to the site.  Long discussion with the patient regarding clinical diagnosis of these nodules and the right axilla mass.  Patient was advised that this provider would review all findings with Dr. Jana Hakim  tomorrow; and then discuss with the patient.  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  The mammogram is scheduled for 03/01/2016; but the head CT and a breast MRI have not been scheduled as of yet.  Patient is scheduled to return for labs only on 03/16/2016.  Patient is scheduled for follow-up visit with Dr. Jana Hakim on 03/23/2016.    HPI  Review of Systems  Skin:       Generalized nodules to various areas of her body that appeared to be increasing.  Also, patient has noticed a right axilla mass as well.  All other systems reviewed and are negative.   Past Medical History  Diagnosis Date  . Breast cancer ([place]) 2010  . Palpitations   . Atypical chest pain   . Tobacco abuse     Past Surgical History  Procedure Laterality Date  . Abdominal hysterectomy  2012    TAH/BSO  . Mastectomy Left 2010  . Cesarean section  1994  . Knee surgery Left 1999    has Breast cancer of upper-outer quadrant of left female breast ([place]); Genetic testing; Palpitations; Atypical chest pain; and Mass of right axilla on her problem list.    is allergic to indomethacin.    Medication List       This list is accurate as of: 12/29/15  3:16 PM.  Always use your most recent med list.  anastrozole 1 MG tablet  Commonly known as:  ARIMIDEX  Take 1 tablet (1 mg total) by mouth daily.     Biotin 5000 MCG Caps  Take 1 capsule by mouth daily.     GLUCOSAMINE CHONDROITIN MSM PO  Take 2 tablets by mouth daily. Each tablet has 1559m     HM CALCIUM 600+D PLUS MINERALS PO  Take by mouth.     Potassium Gluconate 595 MG Caps  Take 1 capsule by mouth daily.     SUPER B COMPLEX PO  Take 1 tablet by mouth daily.         PHYSICAL EXAMINATION  Oncology Vitals 12/29/2015 12/16/2015  Height 168 cm 168 cm  Weight 76.159 kg 76.204 kg  Weight (lbs) 167 lbs 14 oz 168 lbs  BMI (kg/m2) 27.1  kg/m2 27.12 kg/m2  Temp 98.7 -  Pulse 75 80  Resp 18 -  SpO2 100 -  BSA (m2) 1.88 m2 1.88 m2   BP Readings from Last 2 Encounters:  12/29/15 117/65  12/16/15 110/62    Physical Exam  Constitutional: She is oriented to person, place, and time and well-developed, well-nourished, and in no distress.  HENT:  Head: Normocephalic and atraumatic.  Eyes: Conjunctivae and EOM are normal. Pupils are equal, round, and reactive to light. Right eye exhibits no discharge. Left eye exhibits no discharge. No scleral icterus.  Neck: Normal range of motion.  Pulmonary/Chest: Effort normal. No respiratory distress.  Musculoskeletal: Normal range of motion. She exhibits no edema or tenderness.  Neurological: She is alert and oriented to person, place, and time. Gait normal.  Skin: Skin is warm and dry. No rash noted. No erythema. No pallor.  Exam today reveals a in approximately 1.5 cm firm mass to the right axilla region.  This area is nontender.  There is also no erythema, edema, warmth, or red streaks to the site.  Also, patient has multiple raised nodules to her bilateral forearms, her anterior right thigh, her right inguinal region; and also has some firm nodules noted with deep palpation to the upper abdominal region.  Patient also has what appears to be a clogged skin for to the lower portion of her left breast implant.  Patient states that this small lesion frequently recurs; and she usually excretes some foul odor material.  On exam.-This tiny lesion to the lower portion of patient's left breast implant does appear to be a type of clogged skin pore.  There is no evidence of infection to the site.      Psychiatric: Affect normal.  Nursing note and vitals reviewed.   LABORATORY DATA:. No visits with results within 3 Day(s) from this visit. Latest known visit with results is:  Appointment on 11/17/2015  Component Date Value Ref Range Status  . WBC 11/17/2015 11.4* 3.9 - 10.3 10e3/uL  Final  . NEUT# 11/17/2015 4.8  1.5 - 6.5 10e3/uL Final  . HGB 11/17/2015 13.8  11.6 - 15.9 g/dL Final  . HCT 11/17/2015 42.0  34.8 - 46.6 % Final  . Platelets 11/17/2015 217  145 - 400 10e3/uL Final  . MCV 11/17/2015 91.6  79.5 - 101.0 fL Final  . MCH 11/17/2015 30.1  25.1 - 34.0 pg Final  . MCHC 11/17/2015 32.8  31.5 - 36.0 g/dL Final  . RBC 11/17/2015 4.59  3.70 - 5.45 10e6/uL Final  . RDW 11/17/2015 13.3  11.2 - 14.5 % Final  . lymph# 11/17/2015 5.7* 0.9 - 3.3 10e3/uL Final  . MONO# 11/17/2015  0.7  0.1 - 0.9 10e3/uL Final  . Eosinophils Absolute 11/17/2015 0.1  0.0 - 0.5 10e3/uL Final  . Basophils Absolute 11/17/2015 0.1  0.0 - 0.1 10e3/uL Final  . NEUT% 11/17/2015 41.9  38.4 - 76.8 % Final  . LYMPH% 11/17/2015 50.0* 14.0 - 49.7 % Final  . MONO% 11/17/2015 6.2  0.0 - 14.0 % Final  . EOS% 11/17/2015 1.3  0.0 - 7.0 % Final  . BASO% 11/17/2015 0.6  0.0 - 2.0 % Final  . Sodium 11/17/2015 142  136 - 145 mEq/L Final  . Potassium 11/17/2015 3.7  3.5 - 5.1 mEq/L Final  . Chloride 11/17/2015 105  98 - 109 mEq/L Final  . CO2 11/17/2015 28  22 - 29 mEq/L Final  . Glucose 11/17/2015 86  70 - 140 mg/dl Final   Glucose reference range is for nonfasting patients. Fasting glucose reference range is 70- 100.  Marland Kitchen BUN 11/17/2015 10.2  7.0 - 26.0 mg/dL Final  . Creatinine 11/17/2015 0.8  0.6 - 1.1 mg/dL Final  . Total Bilirubin 11/17/2015 <0.30  0.20 - 1.20 mg/dL Final  . Alkaline Phosphatase 11/17/2015 72  40 - 150 U/L Final  . AST 11/17/2015 18  5 - 34 U/L Final  . ALT 11/17/2015 19  0 - 55 U/L Final  . Total Protein 11/17/2015 7.3  6.4 - 8.3 g/dL Final  . Albumin 11/17/2015 4.1  3.5 - 5.0 g/dL Final  . Calcium 11/17/2015 9.5  8.4 - 10.4 mg/dL Final  . Anion Gap 11/17/2015 9  3 - 11 mEq/L Final  . EGFR 11/17/2015 78* >90 ml/min/1.73 m2 Final   eGFR is calculated using the CKD-EPI Creatinine Equation (2009)  . Technologist Review 11/17/2015 Variant lymphs present   Final     RADIOGRAPHIC  STUDIES: No results found.  ASSESSMENT/PLAN:    Mass of right axilla Patient called the cancer Center today with complaint of a newly discovered right axilla mass.  She states that this mass is nontender; but she is concerned of recurrence of her breast cancer.  Patient also has a 19 year history of scattered nodules to her generalized body.  She states that these nodules are slowly increasing in size over the past few years; and she also has noted the same nodules deep within her abdomen.  She states her abdomen is somewhat tender when she palpates her abdomen.  She states that she had a mass removed from her chin, a few years ago as well.  Patient states that one of her physician several years ago instructed her that these nodules were actually lipomas; but patient has had no further evaluation or management of these nodules.  Exam today reveals a in approximately 1.5 cm firm mass to the right axilla region.  This area is nontender.  There is also no erythema, edema, warmth, or red streaks to the site.  Also, patient has multiple raised nodules to her bilateral forearms, her anterior right thigh, her right inguinal region; and also has some firm nodules noted with deep palpation to the upper abdominal region.  Patient also has what appears to be a clogged skin for to the lower portion of her left breast implant.  Patient states that this small lesion frequently recurs; and she usually excretes some foul odor material.  On exam.-This tiny lesion to the lower portion of patient's left breast implant does appear to be a type of clogged skin pore.  There is no evidence of infection to the site.  Long discussion with the patient regarding clinical diagnosis of these nodules and the right axilla mass.  Patient was advised that this provider would review all findings with Dr. Jana Hakim tomorrow; and then discuss with the patient.  Of note-patient has been ordered a head CT for further evaluation of  her scalp metastasis, a mammogram and a breast MRI of the right breast.  The mammogram is scheduled for 03/01/2016; but the head CT and a breast MRI have not been scheduled as of yet.  Patient is scheduled to return for labs only on 03/16/2016.  Patient is scheduled for follow-up visit with Dr. Jana Hakim on 03/23/2016.    Breast cancer of upper-outer quadrant of left female breast [facility]) Patient continues to take anastrozole as previously directed.  Patient has been ordered a head CT, a right breast mammogram and MRI for restaging purposes.  As of yet-patient has not scheduled the head CT or the breast MRI.  Patient's right breast mammogram is scheduled for 03/01/2016.   Patient is scheduled for labs on 03/16/2016; and a follow-up visit with Dr. Jana Hakim on 03/23/2016.  Patient stated understanding of all instructions; and was in agreement with this plan of care. The patient knows to call the clinic with any problems, questions or concerns.   Review/collaboration with Dr. Jana Hakim regarding all aspects of patient's visit today.   Total time spent with patient was 25 minutes;  with greater than 75 percent of that time spent in face to face counseling regarding patient's symptoms,  and coordination of care and follow up.  Disclaimer:This dictation was prepared with Dragon/digital dictation along with [employer]. Any transcriptional errors that result from this process are unintentional.  Drue Second, NP 12/29/2015

## 2015-12-30 ENCOUNTER — Other Ambulatory Visit: Payer: Self-pay | Admitting: Oncology

## 2015-12-30 DIAGNOSIS — N63 Unspecified lump in unspecified breast: Secondary | ICD-10-CM

## 2015-12-30 NOTE — Progress Notes (Signed)
TC to patient to advise Diagnostic [facility] scheduled for 3/29 at 1:40pm. Pt confirms appt. Pt will bring records with her for mammogram completed on Right side in the past. Pt states she had a normal mammogram but they did find dense tissue, otherwise normal. She has not had a mammogram on that side since.   Dr. Virgie Dad collab nurse advised of schedule change for mammo. Req printed and will be signed and faxed to [facility]

## 2015-12-31 ENCOUNTER — Other Ambulatory Visit: Payer: Self-pay

## 2015-12-31 DIAGNOSIS — C50412 Malignant neoplasm of upper-outer quadrant of left female breast: Secondary | ICD-10-CM

## 2016-01-04 ENCOUNTER — Telehealth: Payer: Self-pay | Admitting: *Deleted

## 2016-01-04 ENCOUNTER — Telehealth: Payer: Self-pay

## 2016-01-04 NOTE — Telephone Encounter (Signed)
Patient called regarding appt scheduled for her mammogram.  She feels that it should be scheduled sooner.  Routed call to Val, RN because patient stated that she had just hung up with Val regarding appt.

## 2016-01-04 NOTE — Telephone Encounter (Signed)
This RN received call from pt stating " the breast scans that was scheduled because I found this new lump have been canceled because they said I would need to bring my films "  " I do not have them- they were last done at [facility]"  This RN reviewed chart and noted pt was seen last week by Selena Lesser with papable nodule under arm.  Per appointments mammo and u/s were canceled and not rescheduled.  This RN contacted the [facility] per above and was informed  "scans cannot be performed until we have her prior films for comparison "   Per this RN's discussion above inclduing concern due to palable nodule and need for evaluation-plan is to schedule scan for next week - films will be requested and if received prior to 4/6 appointment - pt will be brought in sooner.  This RN informed pt who is in the process of communicating with [facility] to release films for current studies.

## 2016-01-06 ENCOUNTER — Other Ambulatory Visit: Payer: 59

## 2016-01-14 ENCOUNTER — Other Ambulatory Visit: Payer: Self-pay | Admitting: Oncology

## 2016-01-14 ENCOUNTER — Ambulatory Visit
Admission: RE | Admit: 2016-01-14 | Discharge: 2016-01-14 | Disposition: A | Discharge status: Home / Self Care | Payer: 59 | Source: Ambulatory Visit | Attending: Oncology | Admitting: Oncology

## 2016-01-14 ENCOUNTER — Other Ambulatory Visit: Payer: 59

## 2016-01-14 DIAGNOSIS — N63 Unspecified lump in unspecified breast: Secondary | ICD-10-CM

## 2016-01-20 ENCOUNTER — Ambulatory Visit (HOSPITAL_COMMUNITY): Payer: 59

## 2016-01-26 ENCOUNTER — Ambulatory Visit (HOSPITAL_COMMUNITY)
Admission: RE | Admit: 2016-01-26 | Discharge: 2016-01-26 | Disposition: A | Discharge status: Home / Self Care | Payer: 59 | Source: Ambulatory Visit | Attending: Oncology | Admitting: Oncology

## 2016-01-26 DIAGNOSIS — C50412 Malignant neoplasm of upper-outer quadrant of left female breast: Secondary | ICD-10-CM

## 2016-01-26 DIAGNOSIS — M899 Disorder of bone, unspecified: Secondary | ICD-10-CM | POA: Insufficient documentation

## 2016-03-01 ENCOUNTER — Ambulatory Visit: Payer: 59

## 2016-03-16 ENCOUNTER — Other Ambulatory Visit: Payer: Self-pay | Admitting: *Deleted

## 2016-03-16 ENCOUNTER — Other Ambulatory Visit: Payer: 59

## 2016-03-16 DIAGNOSIS — C50412 Malignant neoplasm of upper-outer quadrant of left female breast: Secondary | ICD-10-CM

## 2016-03-17 ENCOUNTER — Other Ambulatory Visit: Payer: Self-pay | Admitting: *Deleted

## 2016-03-17 ENCOUNTER — Other Ambulatory Visit: Payer: 59

## 2016-03-23 ENCOUNTER — Other Ambulatory Visit (HOSPITAL_BASED_OUTPATIENT_CLINIC_OR_DEPARTMENT_OTHER): Payer: 59

## 2016-03-23 ENCOUNTER — Telehealth: Payer: Self-pay | Admitting: Oncology

## 2016-03-23 ENCOUNTER — Other Ambulatory Visit: Payer: Self-pay | Admitting: *Deleted

## 2016-03-23 ENCOUNTER — Ambulatory Visit (HOSPITAL_BASED_OUTPATIENT_CLINIC_OR_DEPARTMENT_OTHER): Payer: 59 | Admitting: Oncology

## 2016-03-23 VITALS — BP 122/78 | HR 76 | Temp 98.5°F | Resp 18 | Ht 66.0 in | Wt 168.1 lb

## 2016-03-23 DIAGNOSIS — C792 Secondary malignant neoplasm of skin: Secondary | ICD-10-CM | POA: Diagnosis not present

## 2016-03-23 DIAGNOSIS — C50412 Malignant neoplasm of upper-outer quadrant of left female breast: Secondary | ICD-10-CM | POA: Diagnosis not present

## 2016-03-23 DIAGNOSIS — E876 Hypokalemia: Secondary | ICD-10-CM

## 2016-03-23 DIAGNOSIS — Z720 Tobacco use: Secondary | ICD-10-CM

## 2016-03-23 DIAGNOSIS — D175 Benign lipomatous neoplasm of intra-abdominal organs: Secondary | ICD-10-CM

## 2016-03-23 DIAGNOSIS — R911 Solitary pulmonary nodule: Secondary | ICD-10-CM

## 2016-03-23 DIAGNOSIS — M8580 Other specified disorders of bone density and structure, unspecified site: Secondary | ICD-10-CM

## 2016-03-23 LAB — COMPREHENSIVE METABOLIC PANEL
ALT: 14 U/L (ref 0–55)
AST: 16 U/L (ref 5–34)
Albumin: 3.9 g/dL (ref 3.5–5.0)
Alkaline Phosphatase: 76 U/L (ref 40–150)
Anion Gap: 7 mEq/L (ref 3–11)
BUN: 13.1 mg/dL (ref 7.0–26.0)
CHLORIDE: 105 meq/L (ref 98–109)
CO2: 28 meq/L (ref 22–29)
CREATININE: 0.8 mg/dL (ref 0.6–1.1)
Calcium: 9.9 mg/dL (ref 8.4–10.4)
EGFR: 82 mL/min/{1.73_m2} — ABNORMAL LOW (ref >90)
GLUCOSE: 117 mg/dL (ref 70–140)
POTASSIUM: 3.9 meq/L (ref 3.5–5.1)
SODIUM: 140 meq/L (ref 136–145)
Total Bilirubin: <0.3 mg/dL (ref 0.20–1.20)
Total Protein: 7.4 g/dL (ref 6.4–8.3)

## 2016-03-23 LAB — CBC WITH DIFFERENTIAL/PLATELET
BASO%: 0.3 % (ref 0.0–2.0)
BASOS ABS: 0 10*3/uL (ref 0.0–0.1)
EOS%: 1.4 % (ref 0.0–7.0)
Eosinophils Absolute: 0.2 10*3/uL (ref 0.0–0.5)
HEMATOCRIT: 41.2 % (ref 34.8–46.6)
HGB: 14 g/dL (ref 11.6–15.9)
LYMPH#: 5.7 10*3/uL — AB (ref 0.9–3.3)
LYMPH%: 48.1 % (ref 14.0–49.7)
MCH: 31 pg (ref 25.1–34.0)
MCHC: 34 g/dL (ref 31.5–36.0)
MCV: 91.2 fL (ref 79.5–101.0)
MONO#: 0.5 10*3/uL (ref 0.1–0.9)
MONO%: 4.4 % (ref 0.0–14.0)
NEUT#: 5.4 10*3/uL (ref 1.5–6.5)
NEUT%: 45.8 % (ref 38.4–76.8)
Platelets: 214 10*3/uL (ref 145–400)
RBC: 4.52 10*6/uL (ref 3.70–5.45)
RDW: 13.3 % (ref 11.2–14.5)
WBC: 11.8 10*3/uL — ABNORMAL HIGH (ref 3.9–10.3)

## 2016-03-23 MED ORDER — ANASTROZOLE 1 MG PO TABS: 1.0000 mg | ORAL_TABLET | Freq: Every day | ORAL | Status: DC

## 2016-03-23 NOTE — Progress Notes (Signed)
[place]  [phone] [phone]     ID: Holly Melton DOB: [birth year]  MR#: [id number]  CXK#:[id number]  Patient Care Team: No Pcp Per Patient as PCP - General (General Practice) Chauncey Cruel, MD as Consulting Physician (Oncology) OTHER MD: Ashok Pall MD _0 , Quay Burow MD  CHIEF COMPLAINT: estrogen receptor positive breast cancer  CURRENT TREATMENT: anastrozole   BREAST CANCER HISTORY: from the original intake note:  Mylani underwent left mastectomy and sentinel lymph node sampling 01/19/2009 for an mpT1c pN0, stage IA invasive breast cancer, which was estrogen receptor positive at 96%, and HER-2 amplified at 3+. [I do not have the actual pathology report; the patient recalls the tumor being progesterone receptor negative, but there is a note from one of her physicians that it was progesterone receptor 80% positive). She was treated under the care of Dr. Luther Hearing in [place] with dose dense doxorubicin and cyclophosphamide followed by dose dense paclitaxel. She also received trastuzumab, which was started duriing the paclitaxel treatment and continued for 1 year. At the completion of chemotherapy she was started on tamoxifen, and continued on that medication between October 2010 and October 2012. In May 2011 she underwent reconstructive surgery with a silicone implant.On September 2012 the patient underwent TAH/BSO with benign pathology. She was then switched to anastrozole, on which she continues.  Her subsequent history is as detailed below, as are other not necessarily related issues which will require further evaluation  INTERVAL HISTORY: Holly Melton returns today for follow-up of her early stage breast cancer. She has been able to continue the anastrozole despite hot flashes, vaginal dryness, and some arthralgias and myalgias. She currently obtains it at $5 per month.  REVIEW OF SYSTEMS:  Holly Melton is not able to exercise because  she works pretty much from 8 in the morning to 6 PM in the evening. When she gets home she does some house errands and housework and watches TV and goes to sleep. She complains of poor circulation and she has a problem at the base of her right foot which she is going to see a podiatrist about. She has heartburn issues. She is worried that she might have Dercum's disease because she is having some discomfort with her abdominal fatty tumors. She thinks this may be compressing some of her organs. Otherwise the lipomas are not painful. She continues to worry that her left reconstructed breast implant has ruptured. It sometimes hurts, it is "pointing in the wrong direction", and basically she needs to have this evaluated further. Aside from these issues a detailed review of systems today was noncontributory  PAST MEDICAL HISTORY: Past Medical History  Diagnosis Date  . Breast cancer ([place]) 2010  . Palpitations   . Atypical chest pain   . Tobacco abuse    History of multiple lipomas, history of hypokalemia, history of an occipital bone lesion, history of pulmonary nodules of uncertain etiology, history of thyroid nodules of uncertain etiology, osteopenia, history of abnormal cervical pathology with HPV positivity, status post robotic total hysterectomy  PAST SURGICAL HISTORY: Past Surgical History  Procedure Laterality Date  . Abdominal hysterectomy  2012    TAH/BSO  . Mastectomy Left 2010  . Cesarean section  1994  . Knee surgery Left 1999   History of emergency C-section, left knee surgery, lipoma removal, bilateral Copper tunnel, pneumonia (July 2010),status post robotic total hysterectomy bilateral salpingo-oophorectomy; removal of right foot plantar warts   FAMILY HISTORY Family History  Problem Relation Age  of Onset  . Multiple myeloma Mother 51  . Colon polyps Father   . Thyroid nodules Father   . Melanoma Sister 35    has had 2-3 times  . Goiter Paternal Aunt   . Liver cancer  Maternal Grandmother 3  . Thyroid cancer Sister   . Asperger's syndrome Other   . Breast cancer Cousin 78    triple negative   The patient's father is currently living age 42. The patient's mother died at the age of 30. She was diagnosed with multiple myeloma at age 38. The patient had no brothers, 5 sisters. One sister has a history of melanoma, and another history of thyroid cancer. There is no other history of breast or ovarian cancer in the family except for a first cousin on the maternal side with breast cancer, triple negative, diagnosed at age 107.  GYNECOLOGIC HISTORY:  No LMP recorded. Patient is postmenopausal. Menarche age 30, was perimenopausal at the time of breast cancer diagnosis in 2010, became menopausal with chemotherapy and underwent TAH/BSO in 2012. She did not use hormone replacement. She never used birth control pills.  SOCIAL HISTORY:  Normal works as a Copywriter, advertising. She trained herself in this field and only later took some courses inorganic and general chemistry. 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 The patient has been separated from her husband Holly Melton for 20 years. He lives in [place]. They are not legally divorced. She has a daughter from that marriage, Holly Melton, lives in [place] and is a homemaker.The patient moved to this area partly because a job opened here and partly because she always aimed to retire in [place]. At home she lives with her significant other, Engineer, manufacturing. Holly Melton Works as an Dietitian and has 2 children of his own, who live in [place] and a plan. He has 2 grandsons. The patient herself has no biologicalgrandchildren. She is a Theatre manager    ADVANCED DIRECTIVES: at her 08/20/2014 visit the patient was given the appropriate documents to complete and notarize at her discretion. She tells me she intends to name her daughter Holly Melton as her healthcare up of attorney. [name] can be  reached at [phone].   HEALTH MAINTENANCE: Social History  Substance Use Topics  . Smoking status: Current Every Day Smoker -- 0.75 packs/day for 45 years  . Smokeless tobacco: Not on file  . Alcohol Use: No     Colonoscopy:  PAP:  Bone density:04/14/2012, T score -1.4  Lipid panel:  Allergies  Allergen Reactions  . Indomethacin     Current Outpatient Prescriptions  Medication Sig Dispense Refill  . anastrozole (ARIMIDEX) 1 MG tablet Take 1 tablet (1 mg total) by mouth daily. 90 tablet 4  . B Complex-C (SUPER B COMPLEX PO) Take 1 tablet by mouth daily.    . Biotin 5000 MCG CAPS Take 1 capsule by mouth daily.    . Calcium Carbonate-Vit D-Min (HM CALCIUM 600+D PLUS MINERALS PO) Take by mouth.    . Misc Natural Products (GLUCOSAMINE CHONDROITIN MSM PO) Take 2 tablets by mouth daily. Each tablet has 1592m    . Potassium Gluconate 595 MG CAPS Take 1 capsule by mouth daily.     No current facility-administered medications for this visit.    OBJECTIVE: middle-aged white woman  Filed Vitals:   03/23/16 1145  BP: 122/78  Pulse: 76  Temp: 98.5 F (36.9 C)  Resp: 18     Body mass index  is 27.15 kg/(m^2).    ECOG FS:1 - Symptomatic but completely ambulatory  Sclerae unicteric, EOMs intact Oropharynx clear, no thrush or other lesions, no masses palpated No cervical or supraclavicular adenopathy Lungs no rales or rhonchi Heart regular rate and rhythm Abd soft, nontender, positive bowel sounds MSK no focal spinal tenderness, no upper extremity lymphedema Neuro: nonfocal, well oriented, appropriate affect Breasts: The right breast is unremarkable. The left breast status post mastectomy with implant reconstruction. In the lower inner quadrant there is an area that "points" and this is of concern to her. I do not palpate anything suggestive of disease recurrence. The left axilla is benign.   LAB RESULTS:  CMP     Component Value Date/Time   NA 142 11/17/2015 1509   NA  140 11/02/2015 0757   K 3.7 11/17/2015 1509   K 4.1 11/02/2015 0757   CL 104 11/02/2015 0757   CO2 28 11/17/2015 1509   CO2 25 11/02/2015 0757   GLUCOSE 86 11/17/2015 1509   GLUCOSE 84 11/02/2015 0757   BUN 10.2 11/17/2015 1509   BUN 14 11/02/2015 0757   CREATININE 0.8 11/17/2015 1509   CREATININE 0.66 11/02/2015 0757   CREATININE 0.69 08/20/2014 1620   CALCIUM 9.5 11/17/2015 1509   CALCIUM 9.1 11/02/2015 0757   PROT 7.3 11/17/2015 1509   PROT 6.7 11/02/2015 0757   PROT 6.7 11/02/2015 0757   ALBUMIN 4.1 11/17/2015 1509   ALBUMIN 4.2 11/02/2015 0757   ALBUMIN 4.2 11/02/2015 0757   AST 18 11/17/2015 1509   AST 15 11/02/2015 0757   AST 15 11/02/2015 0757   ALT 19 11/17/2015 1509   ALT 15 11/02/2015 0757   ALT 15 11/02/2015 0757   ALKPHOS 72 11/17/2015 1509   ALKPHOS 68 11/02/2015 0757   ALKPHOS 68 11/02/2015 0757   BILITOT <0.30 11/17/2015 1509   BILITOT 0.5 11/02/2015 0757   BILITOT 0.5 11/02/2015 0757    INo results found for: SPEP, UPEP  Lab Results  Component Value Date   WBC 11.4* 11/17/2015   NEUTROABS 4.8 11/17/2015   HGB 13.8 11/17/2015   HCT 42.0 11/17/2015   MCV 91.6 11/17/2015   PLT 217 11/17/2015      Chemistry      Component Value Date/Time   NA 142 11/17/2015 1509   NA 140 11/02/2015 0757   K 3.7 11/17/2015 1509   K 4.1 11/02/2015 0757   CL 104 11/02/2015 0757   CO2 28 11/17/2015 1509   CO2 25 11/02/2015 0757   BUN 10.2 11/17/2015 1509   BUN 14 11/02/2015 0757   CREATININE 0.8 11/17/2015 1509   CREATININE 0.66 11/02/2015 0757   CREATININE 0.69 08/20/2014 1620      Component Value Date/Time   CALCIUM 9.5 11/17/2015 1509   CALCIUM 9.1 11/02/2015 0757   ALKPHOS 72 11/17/2015 1509   ALKPHOS 68 11/02/2015 0757   ALKPHOS 68 11/02/2015 0757   AST 18 11/17/2015 1509   AST 15 11/02/2015 0757   AST 15 11/02/2015 0757   ALT 19 11/17/2015 1509   ALT 15 11/02/2015 0757   ALT 15 11/02/2015 0757   BILITOT <0.30 11/17/2015 1509   BILITOT 0.5  11/02/2015 0757   BILITOT 0.5 11/02/2015 0757       No results found for: LABCA2  No components found for: RDEYC144  No results for input(s): INR in the last 168 hours.  Urinalysis No results found for: COLORURINE, APPEARANCEUR, [place], [place], [place], [place], [place], [place], [place], [place], NITRITE, LEUKOCYTESUR  STUDIES: No results found.  ASSESSMENT: 60 y.o. [place] woman recently relocated to [facility]  (1) status post left mastectomy and sentinel lymph node sampling April 2010 for an mpT1c pN0, stage IA invasive breast cancer, grade 2, estrogen receptor 96% positive, HER-2 amplified  (2) adjuvant chemotherapy consisted of dose dense cyclophosphamide and doxorubicin 4 followed by dose dense paclitaxel 4  (3) trastuzumab was initiated with the paclitaxel treatments and continued to complete a year (to August 2011)  (4) tamoxifen started October 2010 and continued through September 2012  (5) status post robotic TAH/BSO September 2012  (6) on anastrozole as of October 2012  (a) mild osteopenia by bone density July 2013  (7) genetics testing  10/01/2014 through the BreastNext gene panel offered by [employer] found no deleterious mutations in ATM, BARD1, BRCA1, BRCA2, BRIP1, CDH1, CHEK2, MRE11A, MUTYH, NBN, NF1, PALB2, PTEN, RAD50, RAD51C, RAD51D, and TP53. Genetic testing found two variants of uncertain significance,, MUTYH p.R474C and NBN c.2071A>C,  ADDITIONAL CONCERNS:  (a) small pulmonary nodules noted on prior CT scans  (b) nonspecific thyroid nodules noted on prior ultrasounds  (c) occipital bone lesion noted by prior brain MRI  (a) noncontrasted CT 01/26/2016 5 no change as compared to November 2015--this is most consistent with fibrous dysplasia  (d) multiple lipomas syndrome  (e) chronic hypokalemia, unexplained  PLAN:  Suhani continues to be very concerned about her left breast reconstruction. I am setting her up for an MRI and  also a consult with Dr. Elisabeth Cara for further evaluation and advice. If this is ruptured and it is causing her problems of course this can be revised she does have a history of multiple lipomas and this may be what is causing her the abdominal distress. I am setting her up for a CT of the abdomen and pelvis to evaluate this further and if there is any evidence of obstruction or constriction she will be referred to surgery. I am also obtaining a chest x-ray the same day given her question regarding pulmonary nodules in the past.  She needs to establish yourself with a primary care physician here in town and I placed that referral as well.  Otherwise she will see me one more time October of this year. She will stop her anastrozole then and she will be released from follow-up.  She knows to call for any problems that may develop before her next visit here.  Chauncey Cruel, MD   03/23/2016 11:50 AM

## 2016-03-23 NOTE — Telephone Encounter (Signed)
appt made and avs printed. CT to be scheduled by central radiology appt with Dr Marla Roe 7/10 at 130 pm at [place]

## 2016-03-24 ENCOUNTER — Telehealth: Payer: Self-pay | Admitting: Oncology

## 2016-03-24 NOTE — Telephone Encounter (Signed)
Faxed pt medical records to Dr. Marla Roe [id number]

## 2016-03-29 ENCOUNTER — Other Ambulatory Visit: Payer: Self-pay | Admitting: *Deleted

## 2016-03-29 ENCOUNTER — Telehealth: Payer: Self-pay | Admitting: *Deleted

## 2016-03-29 DIAGNOSIS — C50412 Malignant neoplasm of upper-outer quadrant of left female breast: Secondary | ICD-10-CM

## 2016-03-29 DIAGNOSIS — N6459 Other signs and symptoms in breast: Secondary | ICD-10-CM

## 2016-03-29 NOTE — Telephone Encounter (Signed)
"  We have an EPIC order put in the wrong way that needs to be put in correctly.  We never do a breast with contrast.  Please enter the order as with and without contrast.  Cannot schedule until order is placed.  Thanks."

## 2016-04-04 ENCOUNTER — Other Ambulatory Visit: Payer: Self-pay | Admitting: *Deleted

## 2016-04-04 DIAGNOSIS — R1084 Generalized abdominal pain: Secondary | ICD-10-CM

## 2016-04-04 DIAGNOSIS — R1900 Intra-abdominal and pelvic swelling, mass and lump, unspecified site: Secondary | ICD-10-CM

## 2016-04-04 DIAGNOSIS — C50412 Malignant neoplasm of upper-outer quadrant of left female breast: Secondary | ICD-10-CM

## 2016-04-04 DIAGNOSIS — R109 Unspecified abdominal pain: Secondary | ICD-10-CM

## 2016-04-06 ENCOUNTER — Ambulatory Visit (HOSPITAL_COMMUNITY): Payer: 59

## 2016-04-07 ENCOUNTER — Ambulatory Visit
Admission: RE | Admit: 2016-04-07 | Discharge: 2016-04-07 | Disposition: A | Discharge status: Home / Self Care | Payer: 59 | Source: Ambulatory Visit | Attending: Oncology | Admitting: Oncology

## 2016-04-07 DIAGNOSIS — N6459 Other signs and symptoms in breast: Secondary | ICD-10-CM

## 2016-04-07 DIAGNOSIS — C50412 Malignant neoplasm of upper-outer quadrant of left female breast: Secondary | ICD-10-CM

## 2016-04-07 MED ORDER — GADOBENATE DIMEGLUMINE 529 MG/ML IV SOLN: 15.0000 mL | Freq: Once | INTRAVENOUS | Status: DC | PRN

## 2016-04-12 ENCOUNTER — Other Ambulatory Visit: Payer: Self-pay | Admitting: Oncology

## 2016-07-11 ENCOUNTER — Other Ambulatory Visit: Payer: Self-pay | Admitting: *Deleted

## 2016-07-11 DIAGNOSIS — C50412 Malignant neoplasm of upper-outer quadrant of left female breast: Secondary | ICD-10-CM

## 2016-07-12 ENCOUNTER — Ambulatory Visit (HOSPITAL_BASED_OUTPATIENT_CLINIC_OR_DEPARTMENT_OTHER): Payer: 59

## 2016-07-12 ENCOUNTER — Other Ambulatory Visit: Payer: 59

## 2016-07-12 ENCOUNTER — Telehealth: Payer: Self-pay | Admitting: Oncology

## 2016-07-12 DIAGNOSIS — C50412 Malignant neoplasm of upper-outer quadrant of left female breast: Secondary | ICD-10-CM

## 2016-07-12 LAB — CBC WITH DIFFERENTIAL/PLATELET
BASO%: 0.6 % (ref 0.0–2.0)
BASOS ABS: 0.1 10*3/uL (ref 0.0–0.1)
EOS ABS: 0.2 10*3/uL (ref 0.0–0.5)
EOS%: 1.5 % (ref 0.0–7.0)
HCT: 42.3 % (ref 34.8–46.6)
HGB: 14.1 g/dL (ref 11.6–15.9)
LYMPH%: 45.7 % (ref 14.0–49.7)
MCH: 30.3 pg (ref 25.1–34.0)
MCHC: 33.3 g/dL (ref 31.5–36.0)
MCV: 90.8 fL (ref 79.5–101.0)
MONO#: 0.6 10*3/uL (ref 0.1–0.9)
MONO%: 5.4 % (ref 0.0–14.0)
NEUT%: 46.8 % (ref 38.4–76.8)
NEUTROS ABS: 4.9 10*3/uL (ref 1.5–6.5)
PLATELETS: 234 10*3/uL (ref 145–400)
RBC: 4.66 10*6/uL (ref 3.70–5.45)
RDW: 13.4 % (ref 11.2–14.5)
WBC: 10.5 10*3/uL — ABNORMAL HIGH (ref 3.9–10.3)
lymph#: 4.8 10*3/uL — ABNORMAL HIGH (ref 0.9–3.3)

## 2016-07-12 LAB — COMPREHENSIVE METABOLIC PANEL
ALK PHOS: 81 U/L (ref 40–150)
ALT: 19 U/L (ref 0–55)
ANION GAP: 8 meq/L (ref 3–11)
AST: 17 U/L (ref 5–34)
Albumin: 3.8 g/dL (ref 3.5–5.0)
BUN: 10.4 mg/dL (ref 7.0–26.0)
CO2: 26 meq/L (ref 22–29)
Calcium: 9.4 mg/dL (ref 8.4–10.4)
Chloride: 105 mEq/L (ref 98–109)
Creatinine: 0.8 mg/dL (ref 0.6–1.1)
EGFR: 77 mL/min/{1.73_m2} — AB (ref >90)
GLUCOSE: 102 mg/dL (ref 70–140)
POTASSIUM: 3.9 meq/L (ref 3.5–5.1)
SODIUM: 139 meq/L (ref 136–145)
TOTAL PROTEIN: 7.3 g/dL (ref 6.4–8.3)

## 2016-07-12 NOTE — Telephone Encounter (Signed)
Patient called to rschd appt. 07/12/16

## 2016-07-19 ENCOUNTER — Ambulatory Visit (HOSPITAL_BASED_OUTPATIENT_CLINIC_OR_DEPARTMENT_OTHER): Payer: 59 | Admitting: Oncology

## 2016-07-19 ENCOUNTER — Other Ambulatory Visit: Payer: Self-pay | Admitting: *Deleted

## 2016-07-19 ENCOUNTER — Ambulatory Visit (HOSPITAL_BASED_OUTPATIENT_CLINIC_OR_DEPARTMENT_OTHER): Payer: 59

## 2016-07-19 VITALS — BP 118/57 | HR 72 | Temp 98.5°F | Resp 18 | Ht 66.0 in | Wt 171.2 lb

## 2016-07-19 DIAGNOSIS — Z170 Estrogen receptor positive status [ER+]: Principal | ICD-10-CM

## 2016-07-19 DIAGNOSIS — Z720 Tobacco use: Secondary | ICD-10-CM

## 2016-07-19 DIAGNOSIS — Z853 Personal history of malignant neoplasm of breast: Secondary | ICD-10-CM

## 2016-07-19 DIAGNOSIS — R911 Solitary pulmonary nodule: Secondary | ICD-10-CM | POA: Diagnosis not present

## 2016-07-19 DIAGNOSIS — N10 Acute tubulo-interstitial nephritis: Secondary | ICD-10-CM

## 2016-07-19 DIAGNOSIS — C50412 Malignant neoplasm of upper-outer quadrant of left female breast: Secondary | ICD-10-CM

## 2016-07-19 LAB — URINALYSIS, MICROSCOPIC - CHCC
Bilirubin (Urine): NEGATIVE
GLUCOSE UR CHCC: NEGATIVE mg/dL
Ketones: NEGATIVE mg/dL
Nitrite: NEGATIVE
PROTEIN: NEGATIVE mg/dL
Specific Gravity, Urine: 1.005 (ref 1.003–1.035)
UROBILINOGEN UR: 0.2 mg/dL (ref 0.2–1)
pH: 6.5 (ref 4.6–8.0)

## 2016-07-19 MED ORDER — NITROFURANTOIN MONOHYD MACRO 100 MG PO CAPS: 100.0000 mg | ORAL_CAPSULE | Freq: Two times a day (BID) | ORAL | 0 refills | Status: DC

## 2016-07-19 NOTE — Progress Notes (Signed)
[place]  [phone] Fax:([phone]) [phone]     ID: Holly Melton DOB: Aug 19, 1956  MR#: [id number]  TKW#:[id number]  Patient Care Team: No Pcp Per Patient as PCP - General (General Practice) Chauncey Cruel, MD as Consulting Physician (Oncology) OTHER MD: Ashok Pall MD _0 , Quay Burow MD  CHIEF COMPLAINT: estrogen receptor positive breast cancer  CURRENT TREATMENT: Completing 5 years of antiestrogen treatment   BREAST CANCER HISTORY: from the original intake note:  Holly Melton underwent left mastectomy and sentinel lymph node sampling 01/19/2009 for an mpT1c pN0, stage IA invasive breast cancer, which was estrogen receptor positive at 96%, and HER-2 amplified at 3+. [I do not have the actual pathology report; the patient recalls the tumor being progesterone receptor negative, but there is a note from one of her physicians that it was progesterone receptor 80% positive). She was treated under the care of Dr. Luther Hearing in [place] with dose dense doxorubicin and cyclophosphamide followed by dose dense paclitaxel. She also received trastuzumab, which was started duriing the paclitaxel treatment and continued for 1 year. At the completion of chemotherapy she was started on tamoxifen, and continued on that medication between October 2010 and October 2012. In May 2011 she underwent reconstructive surgery with a silicone implant.On September 2012 the patient underwent TAH/BSO with benign pathology. She was then switched to anastrozole, on which she continues.  Her subsequent history is as detailed below, as are other not necessarily related issues which will require further evaluation  INTERVAL HISTORY: Holly Melton returns today for follow-up of her estrogen receptor positive breast cancer. She continues on anastrozole but is very ready to discontinue it. She tells me that at this point the hot flashes and vaginal dryness problems are "just the way things are".  Hopefully some of that will improve soon, as she goes off the antiestrogen.  REVIEW OF SYSTEMS:  Holly Melton is not exercising regularly. She has been started on Bactrim for urinary tract infection but after 3 days of treatment she feels she still has symptoms. Aside from this a detailed review of systems today was stable.  PAST MEDICAL HISTORY: Past Medical History:  Diagnosis Date  . Atypical chest pain   . Breast cancer ([place]) 2010  . Palpitations   . Tobacco abuse    History of multiple lipomas, history of hypokalemia, history of an occipital bone lesion, history of pulmonary nodules of uncertain etiology, history of thyroid nodules of uncertain etiology, osteopenia, history of abnormal cervical pathology with HPV positivity, status post robotic total hysterectomy  PAST SURGICAL HISTORY: Past Surgical History:  Procedure Laterality Date  . ABDOMINAL HYSTERECTOMY  2012   TAH/BSO  . CESAREAN SECTION  1994  . KNEE SURGERY Left 1999  . MASTECTOMY Left 2010   History of emergency C-section, left knee surgery, lipoma removal, bilateral Copper tunnel, pneumonia (July 2010),status post robotic total hysterectomy bilateral salpingo-oophorectomy; removal of right foot plantar warts   FAMILY HISTORY Family History  Problem Relation Age of Onset  . Multiple myeloma Mother 109  . Colon polyps Father   . Thyroid nodules Father   . Melanoma Sister 56    has had 2-3 times  . Goiter Paternal Aunt   . Liver cancer Maternal Grandmother 44  . Thyroid cancer Sister   . Asperger's syndrome Other   . Breast cancer Cousin 54    triple negative   The patient's father is currently living age 61. The patient's mother died at the age of 46. She  was diagnosed with multiple myeloma at age 53. The patient had no brothers, 5 sisters. One sister has a history of melanoma, and another history of thyroid cancer. There is no other history of breast or ovarian cancer in the family except for a first cousin on the  maternal side with breast cancer, triple negative, diagnosed at age 81.  GYNECOLOGIC HISTORY:  No LMP recorded. Patient is postmenopausal. Menarche age 23, was perimenopausal at the time of breast cancer diagnosis in 2010, became menopausal with chemotherapy and underwent TAH/BSO in 2012. She did not use hormone replacement. She never used birth control pills.  SOCIAL HISTORY:  Normal works as a Copywriter, advertising. She trained herself in this field and only later took some courses inorganic and general chemistry. 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 The patient has been separated from her husband Holly Melton for 20 years. He lives in [place]. They are not legally divorced. She has a daughter from that marriage, Holly Melton, lives in [place] and is a homemaker.The patient moved to this area partly because a job opened here and partly because she always aimed to retire in [place]. At home she lives with her significant other, Holly Melton. Holly Melton Works as an Dietitian and has 2 children of his own, who live in [place] and a plan. He has 2 grandsons. The patient herself has no biologicalgrandchildren. She is a Theatre manager    ADVANCED DIRECTIVES: at her 08/20/2014 visit the patient was given the appropriate documents to complete and notarize at her discretion. She tells me she intends to name her daughter Holly Melton as her healthcare up of attorney. Holly Melton can be reached at [phone].   HEALTH MAINTENANCE: Social History  Substance Use Topics  . Smoking status: Current Every Day Smoker    Packs/day: 0.75    Years: 45.00  . Smokeless tobacco: Not on file  . Alcohol use No     Colonoscopy:  PAP:  Bone density:04/14/2012, T score -1.4  Lipid panel:  Allergies  Allergen Reactions  . Indomethacin     Current Outpatient Prescriptions  Medication Sig Dispense Refill  . B Complex-C (SUPER B COMPLEX PO) Take 1 tablet by mouth daily.      . Biotin 5000 MCG CAPS Take 1 capsule by mouth daily.    . Calcium Carbonate-Vit D-Min (HM CALCIUM 600+D PLUS MINERALS PO) Take by mouth.    . co-enzyme Q-10 30 MG capsule Take 1 capsule (30 mg total) by mouth 3 (three) times daily.    Marland Kitchen glucosamine-chondroitin 500-400 MG tablet Take 1 tablet by mouth 3 (three) times daily.    . Misc Natural Products (GLUCOSAMINE CHONDROITIN MSM PO) Take 2 tablets by mouth daily. Each tablet has '1500mg'$     . nitrofurantoin, macrocrystal-monohydrate, (MACROBID) 100 MG capsule Take 1 capsule (100 mg total) by mouth 2 (two) times daily. 10 capsule 0  . Potassium Gluconate 595 MG CAPS Take 1 capsule by mouth daily.     No current facility-administered medications for this visit.     OBJECTIVE: middle-aged white woman    Vitals:   07/19/16 1307  BP: (!) 118/57  Pulse: 72  Resp: 18  Temp: 98.5 F (36.9 C)     Body mass index is 27.63 kg/m.    ECOG FS:1 - Symptomatic but completely ambulatory  Sclerae unicteric, pupils round and equal Oropharynx clear and moist-- no thrush or other lesions No cervical or supraclavicular adenopathy Lungs no rales or  rhonchi Heart regular rate and rhythm Abd soft, nontender, positive bowel sounds MSK no focal spinal tenderness, no upper extremity lymphedema Neuro: nonfocal, well oriented, appropriate affect Breasts: The left breast is status post mastectomy. It is status post implant reconstruction. There is some irregularity, which by MRI earlier this year was found to be a rupture. The left axilla is benign.   LAB RESULTS:  CMP     Component Value Date/Time   NA 139 07/12/2016 1429   K 3.9 07/12/2016 1429   CL 104 11/02/2015 0757   CO2 26 07/12/2016 1429   GLUCOSE 102 07/12/2016 1429   BUN 10.4 07/12/2016 1429   CREATININE 0.8 07/12/2016 1429   CALCIUM 9.4 07/12/2016 1429   PROT 7.3 07/12/2016 1429   ALBUMIN 3.8 07/12/2016 1429   AST 17 07/12/2016 1429   ALT 19 07/12/2016 1429   ALKPHOS 81 07/12/2016 1429    BILITOT <0.30 07/12/2016 1429    INo results found for: SPEP, UPEP  Lab Results  Component Value Date   WBC 10.5 (H) 07/12/2016   NEUTROABS 4.9 07/12/2016   HGB 14.1 07/12/2016   HCT 42.3 07/12/2016   MCV 90.8 07/12/2016   PLT 234 07/12/2016      Chemistry      Component Value Date/Time   NA 139 07/12/2016 1429   K 3.9 07/12/2016 1429   CL 104 11/02/2015 0757   CO2 26 07/12/2016 1429   BUN 10.4 07/12/2016 1429   CREATININE 0.8 07/12/2016 1429      Component Value Date/Time   CALCIUM 9.4 07/12/2016 1429   ALKPHOS 81 07/12/2016 1429   AST 17 07/12/2016 1429   ALT 19 07/12/2016 1429   BILITOT <0.30 07/12/2016 1429       No results found for: LABCA2  No components found for: LABCA125  No results for input(s): INR in the last 168 hours.  Urinalysis    Component Value Date/Time   LABSPEC 1.005 07/19/2016 1401   PHURINE 6.5 07/19/2016 1401   GLUCOSEU Negative 07/19/2016 1401   HGBUR Large 07/19/2016 1401   BILIRUBINUR Negative 07/19/2016 1401   KETONESUR Negative 07/19/2016 1401   PROTEINUR Negative 07/19/2016 1401   UROBILINOGEN 0.2 07/19/2016 1401   NITRITE Negative 07/19/2016 1401   LEUKOCYTESUR Trace 07/19/2016 1401    STUDIES: CLINICAL DATA:  Mri breast w/ and w/o15 ml multihancePrev breast ca dx'd 2010, Saline Implants June 2011, Mastectomy lt may 2010, Chemo June 2010, lt implant infected after Sx, hard and sore x 6 years, no other problems, fam hx cousin age 70  LABS:  Not needed at time of imaging  EXAM: BILATERAL BREAST MRI WITH AND WITHOUT CONTRAST  TECHNIQUE: Multiplanar, multisequence MR images of both breasts were obtained prior to and following the intravenous administration of 15 ml of MultiHance.  THREE-DIMENSIONAL MR IMAGE RENDERING ON INDEPENDENT WORKSTATION:  Three-dimensional MR images were rendered by post-processing of the original MR data on an independent workstation. The three-dimensional MR images were  interpreted, and findings are reported in the following complete MRI report for this study. Three dimensional images were evaluated at the independent DynaCad workstation  COMPARISON:  Previous mammogram.  FINDINGS: Breast composition: b. Scattered fibroglandular tissue.  Background parenchymal enhancement: Mild  Right breast: No mass or abnormal enhancement.  Left breast: No mass or abnormal enhancement.  Lymph nodes: No abnormal appearing lymph nodes.  Ancillary findings: Patient has bilateral silicone implants. The retro pectoral implant on right is intact. The implant on the left shows intracapsular  rupture with partial collapse of the anterior margin of the implant capsule.  IMPRESSION: 1. No evidence of malignancy in either breast. 2. Intracapsular rupture of the left silicone implant. 3. Postsurgical changes on the left from the previous mastectomy.  RECOMMENDATION: Screening mammogram in one year.(Code:[id number])  BI-RADS CATEGORY  2: Benign.   Electronically Signed   By: Lajean Manes M.D.   On: 04/07/2016 12:38  ASSESSMENT: 60 y.o. [place] woman recently relocated to [facility]  (1) status post left mastectomy and sentinel lymph node sampling April 2010 for an mpT1c pN0, stage IA invasive breast cancer, grade 2, estrogen receptor 96% positive, HER-2 amplified  (2) adjuvant chemotherapy consisted of dose dense cyclophosphamide and doxorubicin 4 followed by dose dense paclitaxel 4  (3) trastuzumab was initiated with the paclitaxel treatments and continued to complete a year (to August 2011)  (4) tamoxifen started October 2010 and continued through September 2012  (5) status post robotic TAH/BSO September 2012  (6) on anastrozole as of October 2012  (a) mild osteopenia by bone density July 2013  (7) genetics testing  10/01/2014 through the BreastNext gene panel offered by [employer] found no deleterious mutations in ATM, BARD1, BRCA1,  BRCA2, BRIP1, CDH1, CHEK2, MRE11A, MUTYH, NBN, NF1, PALB2, PTEN, RAD50, RAD51C, RAD51D, and TP53. Genetic testing found two variants of uncertain significance,, MUTYH p.R474C and NBN c.2071A>C,  ADDITIONAL CONCERNS:  (a) small pulmonary nodules noted on prior CT scans  (i) Chest CT scan 12/22/2015 shows stability over several years  (b) nonspecific thyroid nodules noted on prior ultrasounds  (c) occipital bone lesion noted by prior brain MRI  (a) noncontrasted CT 01/26/2016 = no change as compared to November 2015--this is most consistent with fibrous dysplasia  (d) multiple lipomas syndrome  PLAN:  Aysa  is now 7 years out from definitive surgery for her breast cancer with no evidence of disease recurrence. This is very favorable.  She has also completed 5 years of an aromatase inhibitor. She took some tamoxifen prior to that.  At this point I am comfortable with her discontinuing treatment. The benefit of an additional 5 years of an aromatase inhibitor would be minimal if any, in her case. She is very happy to "graduate".  She tells me she is planning to move to [facility] in the next few months accordingly she is not being transitioned to our survivorship program.  She doesn't we'll be glad to see her at anytime in the future if on when the need arises but as of now no further appointments are being made for her here  Chauncey Cruel, MD   07/19/2016 7:37 PM

## 2016-07-20 ENCOUNTER — Telehealth: Payer: Self-pay | Admitting: *Deleted

## 2016-07-20 DIAGNOSIS — C50412 Malignant neoplasm of upper-outer quadrant of left female breast: Secondary | ICD-10-CM

## 2016-07-20 NOTE — Telephone Encounter (Signed)
I returned call to the patient re: UTI reason.  Patient reports she had a premature? Urethra that required dilation 30 yrs ago.  Pt reports she forgot to tell Dr Jana Hakim this yesterday and wants to be sure he is aware.  I advised I would let Dr. Jana Hakim know.  Pt also requested referral to uriology.  Referral order placed.  Routed to Dr. Jana Hakim.

## 2016-07-20 NOTE — Telephone Encounter (Signed)
Call received @ 1010 from pt. Patient stated that she believes she knows why she has been getting so many UTI's here lately due to something that took place in her past. She asked to have her call returned.

## 2016-07-21 LAB — URINE CULTURE

## 2016-07-24 ENCOUNTER — Other Ambulatory Visit: Payer: Self-pay | Admitting: Oncology

## 2016-07-24 DIAGNOSIS — IMO0002 Reserved for concepts with insufficient information to code with codable children: Secondary | ICD-10-CM

## 2016-07-24 NOTE — Telephone Encounter (Signed)
Val I entered referral to Dr Matilde Sprang 10/15

## 2016-08-05 ENCOUNTER — Other Ambulatory Visit (HOSPITAL_BASED_OUTPATIENT_CLINIC_OR_DEPARTMENT_OTHER): Payer: 59

## 2016-08-05 ENCOUNTER — Other Ambulatory Visit: Payer: Self-pay

## 2016-08-05 ENCOUNTER — Telehealth: Payer: Self-pay | Admitting: *Deleted

## 2016-08-05 DIAGNOSIS — C50412 Malignant neoplasm of upper-outer quadrant of left female breast: Secondary | ICD-10-CM

## 2016-08-05 LAB — URINALYSIS, MICROSCOPIC - CHCC
BILIRUBIN (URINE): NEGATIVE
Glucose: NEGATIVE mg/dL
KETONES: NEGATIVE mg/dL
Leukocyte Esterase: NEGATIVE
Nitrite: NEGATIVE
PROTEIN: NEGATIVE mg/dL
Specific Gravity, Urine: 1.01 (ref 1.003–1.035)
Urobilinogen, UR: 0.2 mg/dL (ref 0.2–1)
pH: 6 (ref 4.6–8.0)

## 2016-08-05 NOTE — Telephone Encounter (Signed)
This encounter was created in error - please disregard.

## 2016-08-05 NOTE — Telephone Encounter (Signed)
Pt states she feels like she still has UTI. Is waiting to hear from urologist- was referred by Dr Jana Hakim. Does she need repeat UA and culture or more atx?

## 2016-08-05 NOTE — Telephone Encounter (Signed)
I returned call to patient.  Appt made for lab today.  UA and Culture ordered.  Routed to Dr. Jana Hakim.

## 2016-08-05 NOTE — Progress Notes (Signed)
Dr. Jana Hakim out of the office.  [place] results reviewed with Retta Mac.  She recommends facilitating referral to Urologist rather than antibiotics.  Pt is in agreement.  Appt made with Dr. Matilde Sprang at [facility] for Nov 6 at 10 am.  LMOVM for pt with appt d/t and instructions to call clinic with any questions.

## 2016-08-06 LAB — URINE CULTURE: Organism ID, Bacteria: NO GROWTH

## 2016-08-09 ENCOUNTER — Telehealth: Payer: Self-pay | Admitting: Oncology

## 2016-08-09 NOTE — Telephone Encounter (Signed)
Faxed office notes to Dr. Matilde Sprang [phone]

## 2016-08-09 NOTE — Telephone Encounter (Signed)
Faxed pt records to [facility] dermatology [phone]

## 2016-09-15 DIAGNOSIS — D229 Melanocytic nevi, unspecified: Secondary | ICD-10-CM

## 2016-09-15 HISTORY — DX: Melanocytic nevi, unspecified: D22.9

## 2016-11-10 IMAGING — US US SOFT TISSUE HEAD/NECK
1 series · 14 of 25 positions shown · non-contrast
Comparison: None.

CLINICAL DATA: Breast cancer

EXAM:
THYROID ULTRASOUND
TECHNIQUE: Ultrasound examination of the thyroid gland and adjacent soft
tissues was performed.

[Series 1: us soft tissue head/neck · 0.04mm/px · 14 of 49 slices shown]
[im 1/49]
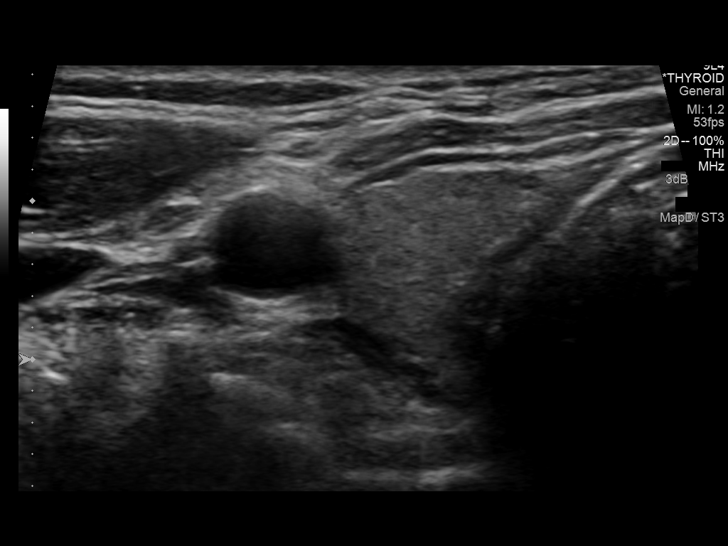
[im 5/49]
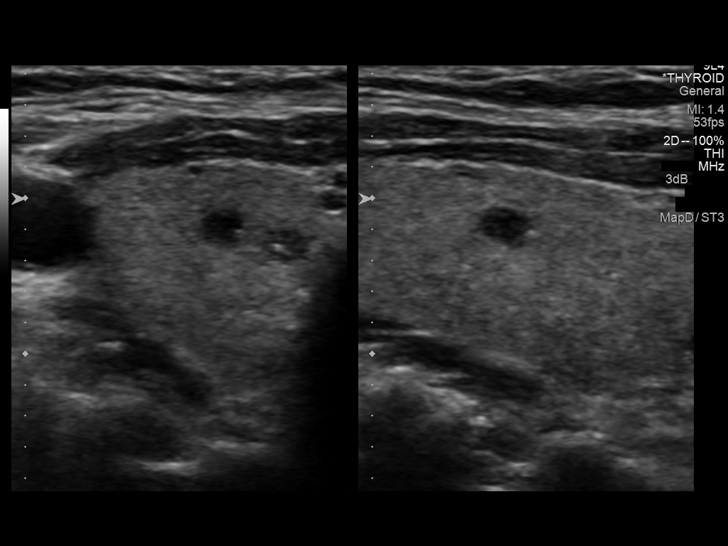
[im 9/49]
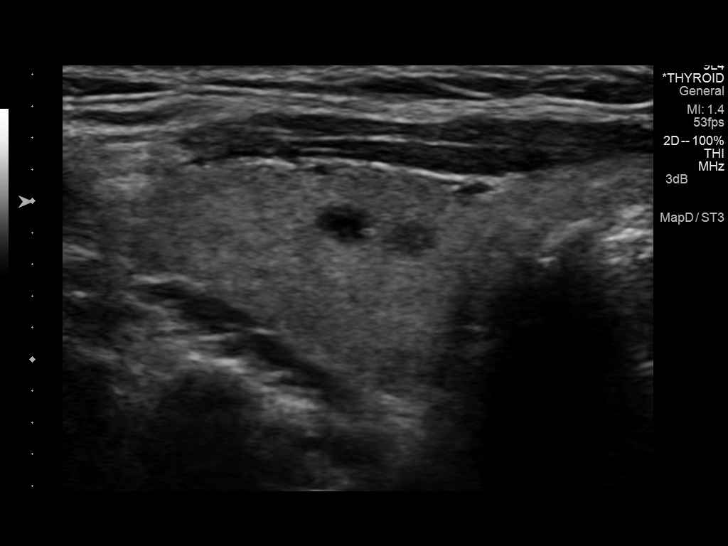
[im 13/49]
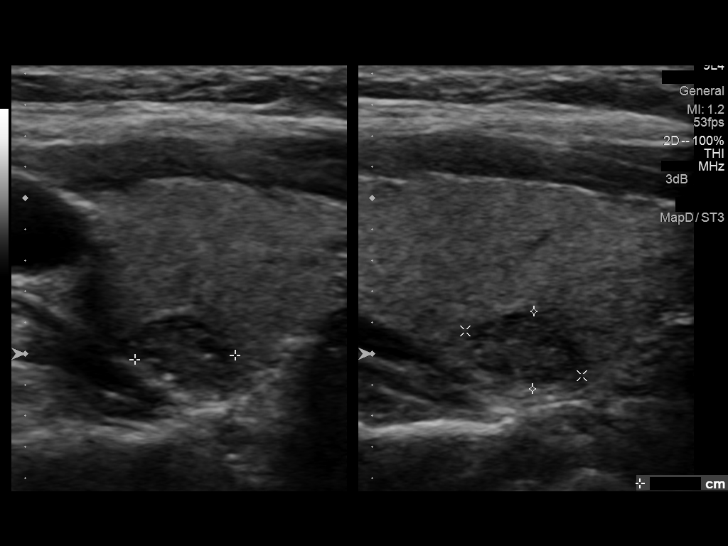
[im 17/49]
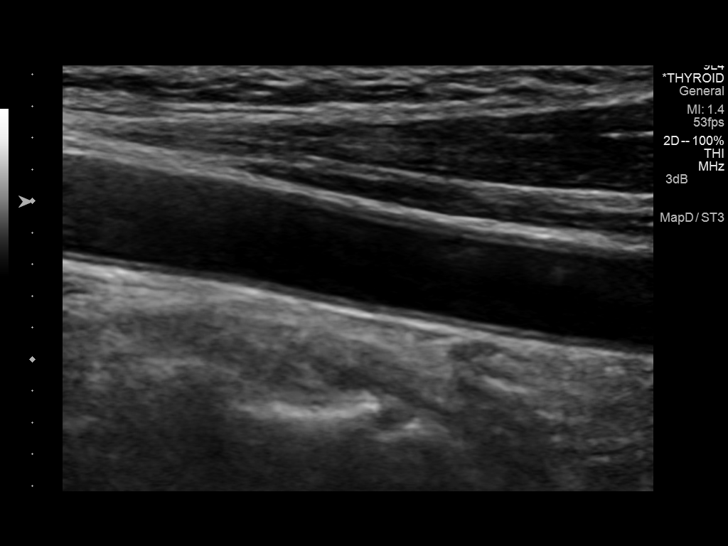
[im 19/49]
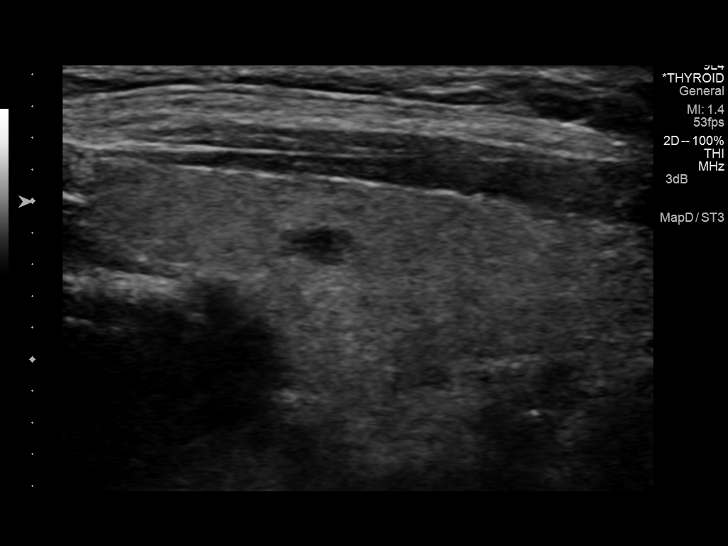
[im 23/49]
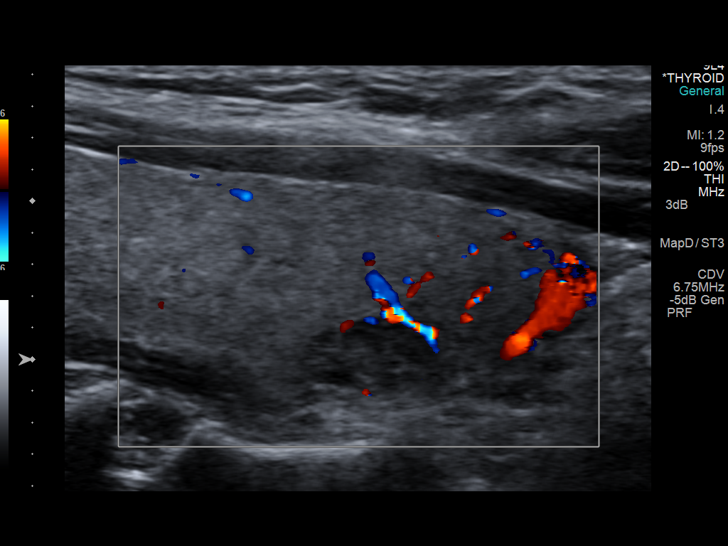
[im 27/49]
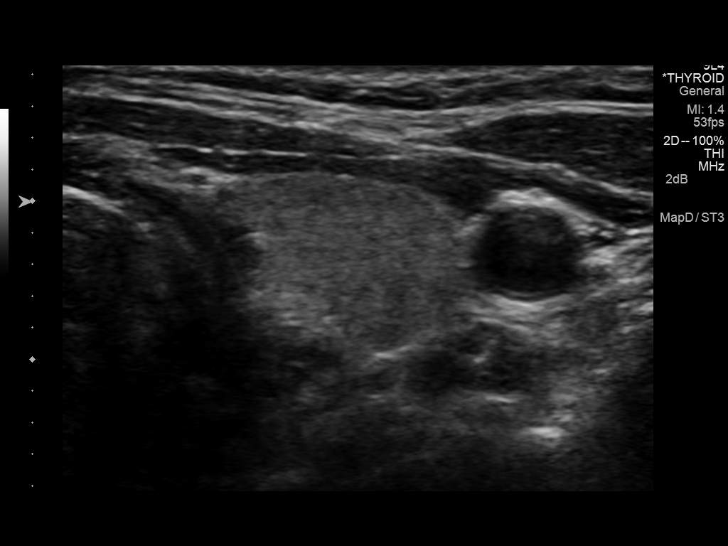
[im 31/49]
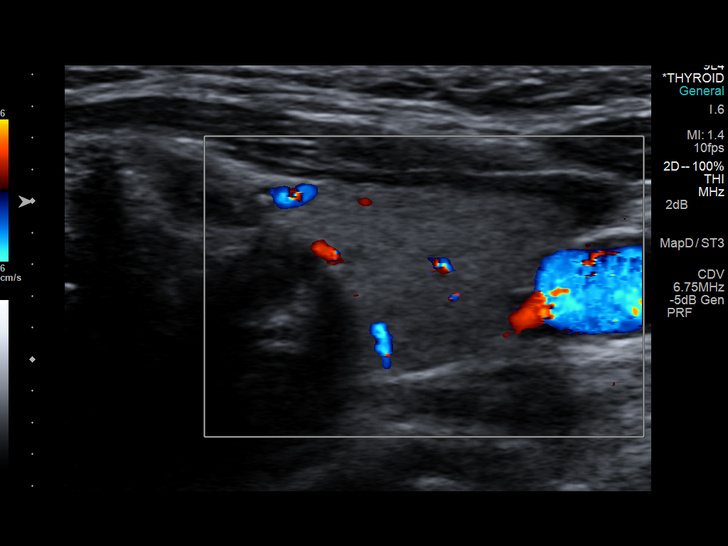
[im 33/49]
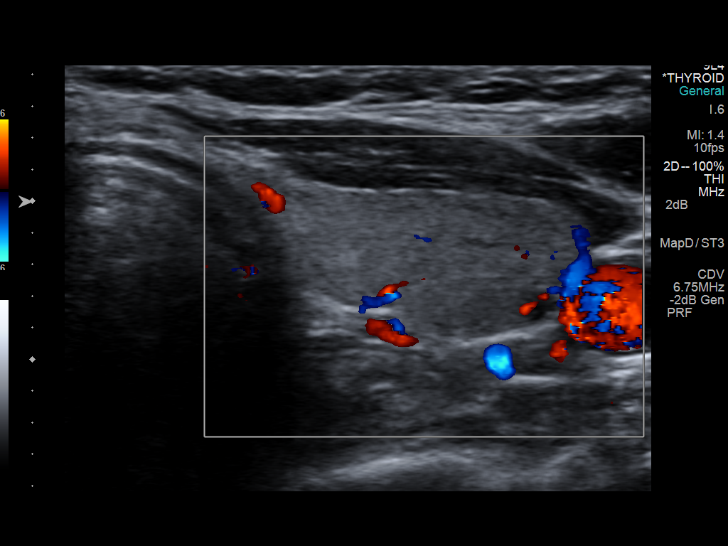
[im 37/49]
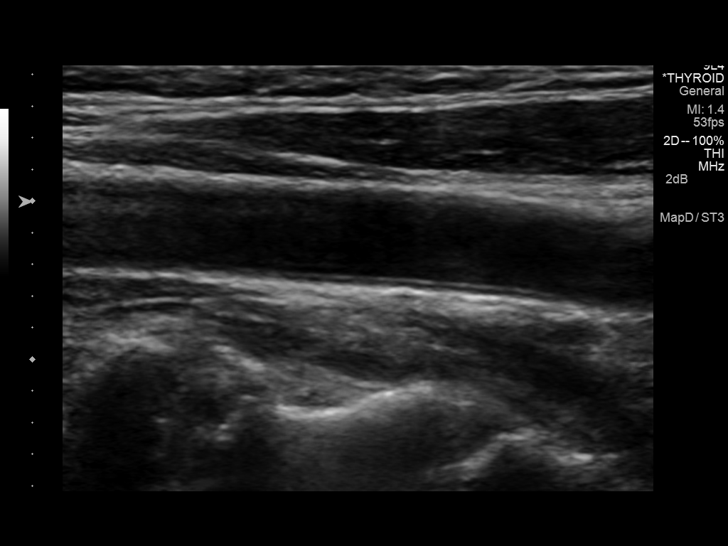
[im 41/49]
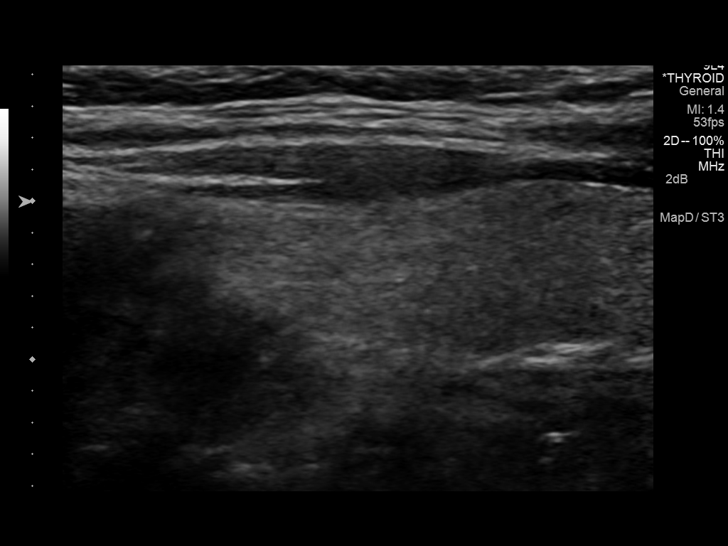
[im 45/49]
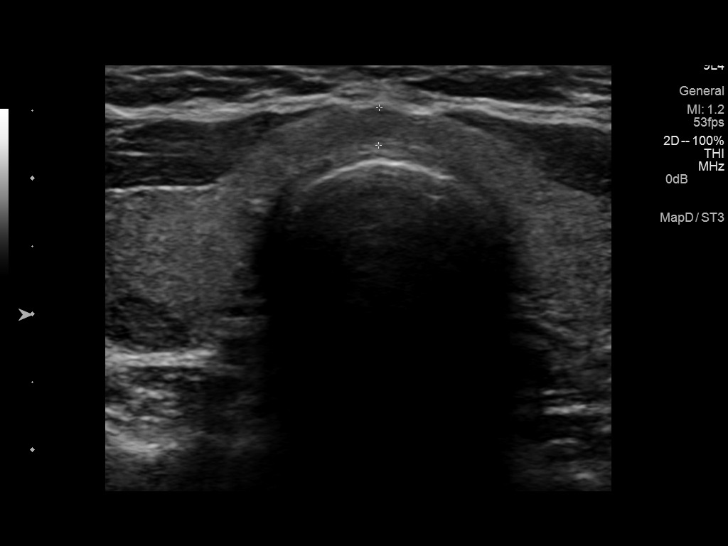
[im 49/49]
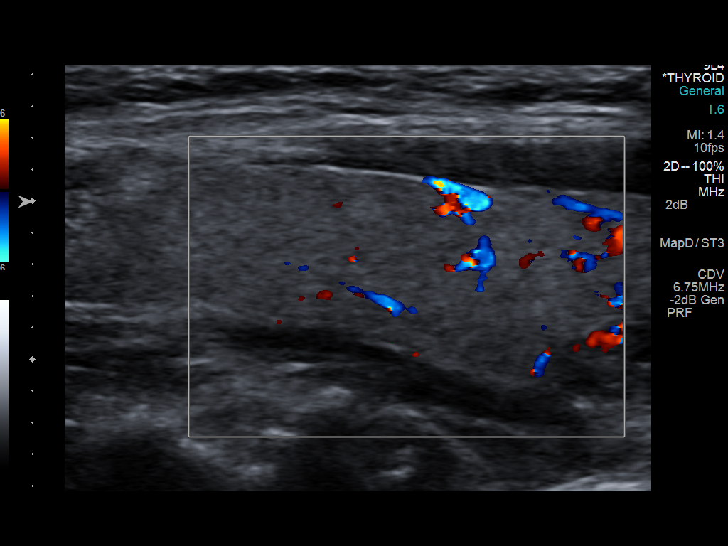

[14 of 25 positions shown; findings below may reference images not displayed]

FINDINGS: Right thyroid lobe

Measurements: 4.7 x 1.3 x 2.1 cm. Complex 4 mm upper pole nodule. 4
mm solid mid nodule. 8 mm solid lower pole nodule.

Left thyroid lobe

Measurements: 4.4 x 1.1 x 1.8 cm. 2 mm upper pole cyst. 3 mm lower
pole solid nodule.

Isthmus

Thickness: 3 mm.  No nodules visualized.

Lymphadenopathy

None visualized.
IMPRESSION: Very small bilateral nodules and cysts. Largest is 8 mm in the lower
pole of the right lobe. Findings do not meet current SRU consensus
criteria for biopsy. Follow-up by clinical exam is recommended. If
patient has known risk factors for thyroid carcinoma, consider
follow-up ultrasound in 12 months. If patient is clinically
hyperthyroid, consider nuclear medicine thyroid uptake and
scan.Reference: Management of Thyroid Nodules Detected at US:
Society of Radiologists in Ultrasound Consensus Conference

## 2016-12-16 ENCOUNTER — Ambulatory Visit (INDEPENDENT_AMBULATORY_CARE_PROVIDER_SITE_OTHER): Payer: 59 | Admitting: Cardiovascular Disease

## 2016-12-16 ENCOUNTER — Encounter: Payer: Self-pay | Admitting: Cardiovascular Disease

## 2016-12-16 VITALS — BP 112/72 | HR 75 | Ht 66.0 in | Wt 173.0 lb

## 2016-12-16 DIAGNOSIS — Z13220 Encounter for screening for lipoid disorders: Secondary | ICD-10-CM | POA: Diagnosis not present

## 2016-12-16 DIAGNOSIS — R0789 Other chest pain: Secondary | ICD-10-CM

## 2016-12-16 DIAGNOSIS — Z720 Tobacco use: Secondary | ICD-10-CM | POA: Insufficient documentation

## 2016-12-16 DIAGNOSIS — R002 Palpitations: Secondary | ICD-10-CM

## 2016-12-16 NOTE — Progress Notes (Signed)
12/16/2016 Holly Melton   Feb 14, 1956  [id number]  Primary Physician No PCP Per Patient Primary Cardiologist: Lorretta Harp MD [name]  HPI:  Holly Melton is a 61 year old mildly overweight separated (for the last 30 years) white female mother of one child who recently relocated from [place] to [place] and works at [employer] as a Librarian, academic".  I last saw her in the office 12/16/15. Her cardiovascular risk factor profile is notable for 40-pack-years of tobacco abuse currently smoking one pack per day. Otherwise there is no history of hypertension, diabetes or hyperlipidemia. There is no family history for heart disease. She is complaining of palpitations for years and occur fairly regularly but does admit to drinking 2-3 cups of coffee a day. She will episode of severe substernal chest pressure lasting 10-15 seconds approximately one week ago which subsided spontaneously. I obtained  a 2-D echo which was normal, Myoview stress test which was low risk and nonischemic and inferior event monitor that showed short runs of PSVT. She has reduced her caffeine intake which has resulted in improvement in the frequency of her palpitations. Since I saw her a year ago she has remained asymptomatic.    Current Outpatient Prescriptions  Medication Sig Dispense Refill  . B Complex-C (SUPER B COMPLEX PO) Take 1 tablet by mouth daily.    . Biotin 5000 MCG CAPS Take 1 capsule by mouth daily.    . Calcium Carbonate-Vit D-Min (HM CALCIUM 600+D PLUS MINERALS PO) Take by mouth.    . co-enzyme Q-10 30 MG capsule Take 1 capsule (30 mg total) by mouth 3 (three) times daily.    Marland Kitchen glucosamine-chondroitin 500-400 MG tablet Take 1 tablet by mouth 3 (three) times daily.    . Misc Natural Products (GLUCOSAMINE CHONDROITIN MSM PO) Take 2 tablets by mouth daily. Each tablet has 1500mg      No current facility-administered medications for this visit.     Allergies  Allergen Reactions  .  Indomethacin     Social History   Social History  . Marital status: Married    Spouse name: N/A  . Number of children: 1  . Years of education: N/A   Occupational History  . Not on file.   Social History Main Topics  . Smoking status: Current Every Day Smoker    Packs/day: 0.75    Years: 45.00  . Smokeless tobacco: Never Used  . Alcohol use No  . Drug use: Unknown  . Sexual activity: Not on file   Other Topics Concern  . Not on file   Social History Narrative  . No narrative on file     Review of Systems: General: negative for chills, fever, night sweats or weight changes.  Cardiovascular: negative for chest pain, dyspnea on exertion, edema, orthopnea, palpitations, paroxysmal nocturnal dyspnea or shortness of breath Dermatological: negative for rash Respiratory: negative for cough or wheezing Urologic: negative for hematuria Abdominal: negative for nausea, vomiting, diarrhea, bright red blood per rectum, melena, or hematemesis Neurologic: negative for visual changes, syncope, or dizziness All other systems reviewed and are otherwise negative except as noted above.    Blood pressure 112/72, pulse 75, height 5\' 6"  (1.676 m), weight 173 lb (78.5 kg).  General appearance: alert and no distress Neck: no adenopathy, no carotid bruit, no JVD, supple, symmetrical, trachea midline and thyroid not enlarged, symmetric, no tenderness/mass/nodules Lungs: clear to auscultation bilaterally Heart: regular rate and rhythm, S1, S2 normal, no murmur, click, rub or  gallop Extremities: extremities normal, atraumatic, no cyanosis or edema  EKG sinus rhythm at 75 without ST or T-wave changes. I personally reviewed this EKG  ASSESSMENT AND PLAN:   Palpitations History of palpitations the monitor showing episodes of PSVT which have all but resolved after reducing her caffeine intake.  Tobacco abuse History of ongoing tobacco abuse one pack per day recalcitrant to risk factor  modification.      Lorretta Harp MD FACP,FACC,FAHA, [facility] 12/16/2016 4:56 PM

## 2016-12-16 NOTE — Assessment & Plan Note (Signed)
History of palpitations the monitor showing episodes of PSVT which have all but resolved after reducing her caffeine intake.

## 2016-12-16 NOTE — Patient Instructions (Signed)

## 2016-12-16 NOTE — Assessment & Plan Note (Signed)
History of ongoing tobacco abuse one pack per day recalcitrant to risk factor modification.

## 2017-01-27 ENCOUNTER — Other Ambulatory Visit: Payer: Self-pay | Admitting: Obstetrics and Gynecology

## 2017-01-27 DIAGNOSIS — Z1231 Encounter for screening mammogram for malignant neoplasm of breast: Secondary | ICD-10-CM

## 2017-02-15 ENCOUNTER — Other Ambulatory Visit: Payer: Self-pay | Admitting: Obstetrics and Gynecology

## 2017-02-15 ENCOUNTER — Ambulatory Visit
Admission: RE | Admit: 2017-02-15 | Discharge: 2017-02-15 | Disposition: A | Discharge status: Home / Self Care | Payer: 59 | Source: Ambulatory Visit | Attending: Obstetrics and Gynecology | Admitting: Obstetrics and Gynecology

## 2017-02-15 DIAGNOSIS — Z1231 Encounter for screening mammogram for malignant neoplasm of breast: Secondary | ICD-10-CM

## 2017-02-15 HISTORY — DX: Malignant neoplasm of endometrium: C54.1

## 2017-02-15 HISTORY — DX: Personal history of antineoplastic chemotherapy: Z92.21

## 2017-05-26 ENCOUNTER — Ambulatory Visit: Payer: Self-pay

## 2017-05-26 ENCOUNTER — Other Ambulatory Visit: Payer: Self-pay | Admitting: Occupational Medicine

## 2017-05-26 DIAGNOSIS — Z0000 Encounter for general adult medical examination without abnormal findings: Secondary | ICD-10-CM

## 2017-07-19 ENCOUNTER — Ambulatory Visit: Payer: 59 | Admitting: Oncology

## 2017-09-29 ENCOUNTER — Telehealth: Payer: Self-pay | Admitting: Adult Health

## 2017-09-29 NOTE — Telephone Encounter (Signed)
Scheduled appt per 12/18 sch message - sent reminder letter in the mail with appt date and time.

## 2017-10-31 ENCOUNTER — Encounter: Payer: Self-pay | Admitting: Adult Health

## 2017-10-31 ENCOUNTER — Telehealth: Payer: Self-pay | Admitting: Adult Health

## 2017-10-31 ENCOUNTER — Inpatient Hospital Stay: Payer: Managed Care, Other (non HMO) | Attending: Adult Health | Admitting: Adult Health

## 2017-10-31 VITALS — BP 113/69 | HR 69 | Temp 98.1°F | Resp 18 | Ht 66.0 in | Wt 171.2 lb

## 2017-10-31 DIAGNOSIS — Z720 Tobacco use: Secondary | ICD-10-CM | POA: Insufficient documentation

## 2017-10-31 DIAGNOSIS — R911 Solitary pulmonary nodule: Secondary | ICD-10-CM | POA: Insufficient documentation

## 2017-10-31 DIAGNOSIS — R1907 Generalized intra-abdominal and pelvic swelling, mass and lump: Secondary | ICD-10-CM | POA: Insufficient documentation

## 2017-10-31 DIAGNOSIS — M8580 Other specified disorders of bone density and structure, unspecified site: Secondary | ICD-10-CM | POA: Insufficient documentation

## 2017-10-31 DIAGNOSIS — R918 Other nonspecific abnormal finding of lung field: Secondary | ICD-10-CM

## 2017-10-31 DIAGNOSIS — Z853 Personal history of malignant neoplasm of breast: Secondary | ICD-10-CM | POA: Diagnosis present

## 2017-10-31 DIAGNOSIS — C50412 Malignant neoplasm of upper-outer quadrant of left female breast: Secondary | ICD-10-CM

## 2017-10-31 NOTE — Progress Notes (Signed)
CLINIC:  Survivorship   REASON FOR VISIT:  Routine follow-up for history of breast cancer.   BRIEF ONCOLOGIC HISTORY:   [place] relocated to [facility]  (1) status post left mastectomy and sentinel lymph node sampling April 2010 for an mpT1c pN0, stage IA invasive breast cancer, grade 2, estrogen receptor 96% positive, HER-2 amplified  (2) adjuvant chemotherapy consisted of dose dense cyclophosphamide and doxorubicin 4 followed by dose dense paclitaxel 4  (3) trastuzumab was initiated with the paclitaxel treatments and continued to complete a year (to August 2011)  (4) tamoxifen started October 2010 and continued through September 2012  (5) status post robotic TAH/BSO September 2012  (6) on anastrozole October 2012-2017             (a) mild osteopenia by bone density July 2013  (7) genetics testing  10/01/2014 through the BreastNext gene panel offered by [employer] found no deleterious mutations in ATM, BARD1, BRCA1, BRCA2, BRIP1, CDH1, CHEK2, MRE11A, MUTYH, NBN, NF1, PALB2, PTEN, RAD50, RAD51C, RAD51D, and TP53. Genetic testing found two variants of uncertain significance,, MUTYH p.R474C and NBN c.2071A>C,  ADDITIONAL CONCERNS:  (a) small pulmonary nodules noted on prior CT scans             (i) Chest CT scan 12/22/2015 shows stability over several years  (b) nonspecific thyroid nodules noted on prior ultrasounds  (c) occipital bone lesion noted by prior brain MRI             (a) noncontrasted CT 01/26/2016 = no change as compared to November 2015--this is most consistent with fibrous dysplasia  (d) multiple lipomas syndrome  INTERVAL HISTORY:  Ms. Botsford presents to the Survivorship Clinic today for routine follow-up for her history of breast cancer.  Overall, she reports feeling quite well. She sees her PCP regualrly, she goes to the gym.  She is up to date with her cancer screenings.  She does have a lump in her abdomen that she would like  evaluated.  She had previously had a CT scan ordered, but nobody called her with an appointment for that.   She also has pulmonary nodules that are likely benign, these need one more scan to demonstrate stability.      REVIEW OF SYSTEMS:  Review of Systems  Constitutional: Negative for appetite change, chills, fatigue, fever and unexpected weight change.  HENT:   Negative for hearing loss and lump/mass.   Eyes: Negative for eye problems and icterus.  Respiratory: Negative for chest tightness, cough and shortness of breath.   Cardiovascular: Negative for chest pain, leg swelling and palpitations.  Gastrointestinal: Negative for abdominal distention, abdominal pain, constipation, diarrhea, nausea and vomiting.  Endocrine: Negative for hot flashes.  Genitourinary: Negative for difficulty urinating.   Musculoskeletal: Negative for arthralgias.  Skin: Negative for itching and rash.  Neurological: Negative for dizziness, extremity weakness, headaches and numbness.  Hematological: Negative for adenopathy. Does not bruise/bleed easily.  Psychiatric/Behavioral: Negative for depression. The patient is not nervous/anxious.   Breast: Denies any new nodularity, masses, tenderness, nipple changes, or nipple discharge.       PAST MEDICAL/SURGICAL HISTORY:  Past Medical History:  Diagnosis Date  . Atypical chest pain   . Breast cancer ([place]) 2010  . Endometrial cancer ([place])   . Palpitations   . Personal history of chemotherapy   . Tobacco abuse    Past Surgical History:  Procedure Laterality Date  . ABDOMINAL HYSTERECTOMY  2012   TAH/BSO  . AUGMENTATION MAMMAPLASTY  Bilateral 2011  . CESAREAN SECTION  1994  . KNEE SURGERY Left 1999  . MASTECTOMY Left 2010     ALLERGIES:  Allergies  Allergen Reactions  . Indomethacin      CURRENT MEDICATIONS:  Outpatient Encounter Medications as of 10/31/2017  Medication Sig  . B Complex-C (SUPER B COMPLEX PO) Take 1 tablet by mouth daily.  .  Biotin 5000 MCG CAPS Take 1 capsule by mouth daily.  . Calcium Carbonate-Vit D-Min (HM CALCIUM 600+D PLUS MINERALS PO) Take by mouth.  . co-enzyme Q-10 30 MG capsule Take 1 capsule (30 mg total) by mouth 3 (three) times daily.  Marland Kitchen glucosamine-chondroitin 500-400 MG tablet Take 1 tablet by mouth 3 (three) times daily.  . [DISCONTINUED] Misc Natural Products (GLUCOSAMINE CHONDROITIN MSM PO) Take 2 tablets by mouth daily. Each tablet has 1544m   No facility-administered encounter medications on file as of 10/31/2017.      ONCOLOGIC FAMILY HISTORY:  Family History  Problem Relation Age of Onset  . Multiple myeloma Mother 873 . Colon polyps Father   . Thyroid nodules Father   . Melanoma Sister 484      has had 2-3 times  . Goiter Paternal Aunt   . Liver cancer Maternal Grandmother 823 . Thyroid cancer Sister   . Asperger's syndrome Other   . Breast cancer Cousin 479      triple negative    GENETIC COUNSELING/TESTING: Not at this time  SOCIAL HISTORY:  NVictor Granadosis single [place]  Ms. 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  Current tobacco use: Smoker, 3/4 ppd x 43 years=32.25 pack year tobacco history.   PHYSICAL EXAMINATION:  Vital Signs: Vitals:   10/31/17 1508  BP: 113/69  Pulse: 69  Resp: 18  Temp: 98.1 F (36.7 C)  SpO2: 100%   Filed Weights   10/31/17 1508  Weight: 171 lb 3.2 oz (77.7 kg)   General: Well-nourished, well-appearing female in no acute distress.  Unaccompanied today.   HEENT: Head is normocephalic.  Pupils equal and reactive to light. Conjunctivae clear without exudate.  Sclerae anicteric. Oral mucosa is pink, moist.  Oropharynx is pink without lesions or erythema.  Lymph: No cervical, supraclavicular, or infraclavicular lymphadenopathy noted on palpation.  Cardiovascular: Regular rate and  rhythm..Marland KitchenRespiratory: Clear to auscultation bilaterally. Chest expansion symmetric; breathing non-labored.  Breast Exam:  -Left breast: No appreciable masses on palpation. No skin redness, thickening, or peau d'orange appearance; no nipple retraction or nipple discharge; mild distortion in symmetry at previous lumpectomy site well healed scar without erythema or nodularity.  -Right breast: No appreciable masses on palpation. No skin redness, thickening, or peau d'orange appearance; no nipple retraction or nipple discharge;  -Axilla: No axillary adenopathy bilaterally.  GI: Abdomen soft and round; non-tender, non-distended. Nodule palpated in epigastric area, up towards spleen about 2cm, soft, unable to determine extent. Bowel sounds normoactive. No hepatosplenomegaly.   GU: Deferred.  Neuro: No focal deficits. Steady gait.  Psych: Mood and affect normal and appropriate for situation.  MSK: No focal spinal tenderness to palpation, full range of motion in bilateral upper extremities Extremities: No edema. Skin: Warm and dry.  LABORATORY DATA:  None for this visit   DIAGNOSTIC IMAGING:  Most recent mammogram:      ASSESSMENT AND PLAN:  Ms.. Melton  a pleasant 62 y.o. female with history of Stage IA left breast invasive ductal carcinoma, ER+/PR+/HER2-, diagnosed in 01/2009, treated with mastectomy, adjuvant chemotherapy, maintenance Trastuzumab x 1 year, and Tamoxifen x 2 years, followed by Anastrozole x 5 years completing in 2017.  She presents to the Survivorship Clinic for surveillance and routine follow-up.   1. History of breast cancer:  Holly Melton is currently clinically and radiographically without evidence of disease or recurrence of breast cancer. She will be due for mammogram in 02/2018; orders placed today.  She will return to see me in one year for routine LTS follow up.  I encouraged her to call me with any questions or concerns before her next visit at the cancer center, and I  would be happy to see her sooner, if needed.    2. Abdominal nodule/mass: I ordered CT abdomen/pelvis to fully evaluate.  3. H/o lung nodules/current every day smoker: Tandrea smokes every day.  She has a greater than 30 pack year history, currently smokes, and is 62 years old.  She meets lung cancer screening criteria.  However, she needs one more CT chest to fully eval her h/o pulmonary nodules, and demonstrate stability.  I ordered CT chest to fully evaluate, to be done with the CT abdomen/pelvis.  Next year, she will go to annual low dose lung cancer screening CTs.    4. Bone health:  Given Holly Melton's age, history of breast cancer, and her previous anti-estrogen therapy with Anastrozole, she is at risk for bone demineralization. I will defer to her PCP regarding future bone density testing and management.  She was given education on specific food and activities to promote bone health.  5. Cancer screening:  Due to Holly Melton's history and her age, she should receive screening for skin cancers, colon cancer, lung cancer (see above), and gynecologic cancers. She was encouraged to follow-up with her PCP for appropriate cancer screenings.   6. Health maintenance and wellness promotion: Ms. Deleo was encouraged to consume 5-7 servings of fruits and vegetables per day. She was also encouraged to engage in moderate to vigorous exercise for 30 minutes per day most days of the week. She was instructed to limit her alcohol consumption and was encouraged to quit smoking.    Dispo:  -Return to cancer center in one year for LTS follow up -Mammogram 02/2018 -CT chest/abdomen/pelvis  A total of (30) minutes of face-to-face time was spent with this patient with greater than 50% of that time in counseling and care-coordination.   Gardenia Phlegm, NP Survivorship [facility] [place] [phone]   Note: PRIMARY CARE PROVIDER Patient, No Pcp Per None None

## 2017-10-31 NOTE — Telephone Encounter (Signed)
Gave patient AVs and calendar of upcoming January 2020 appointments.  °

## 2017-11-03 ENCOUNTER — Telehealth: Payer: Self-pay

## 2017-11-03 ENCOUNTER — Other Ambulatory Visit: Payer: Self-pay | Admitting: *Deleted

## 2017-11-03 NOTE — Telephone Encounter (Signed)
CT set up for pt on 11/08/17 at 9 am, pt to arrive at 8:45. Pt aware and knows to pick up contrast prior to appt and was instructed on times to drink prep.  Pt voiced understanding and had no questions or concerns at this time.

## 2017-11-06 ENCOUNTER — Telehealth: Payer: Self-pay | Admitting: Oncology

## 2017-11-06 NOTE — Telephone Encounter (Signed)
Scheduled appts per 1/25 sch msg - spoke with patient regarding appts that were added.

## 2017-11-07 ENCOUNTER — Other Ambulatory Visit: Payer: Self-pay | Admitting: *Deleted

## 2017-11-07 DIAGNOSIS — C50412 Malignant neoplasm of upper-outer quadrant of left female breast: Secondary | ICD-10-CM

## 2017-11-08 ENCOUNTER — Encounter (HOSPITAL_COMMUNITY): Payer: Self-pay

## 2017-11-08 ENCOUNTER — Inpatient Hospital Stay: Payer: Managed Care, Other (non HMO)

## 2017-11-08 ENCOUNTER — Ambulatory Visit (HOSPITAL_COMMUNITY)
Admission: RE | Admit: 2017-11-08 | Discharge: 2017-11-08 | Disposition: A | Discharge status: Home / Self Care | Payer: Managed Care, Other (non HMO) | Source: Ambulatory Visit | Attending: Adult Health | Admitting: Adult Health

## 2017-11-08 DIAGNOSIS — C50412 Malignant neoplasm of upper-outer quadrant of left female breast: Secondary | ICD-10-CM

## 2017-11-08 DIAGNOSIS — R1907 Generalized intra-abdominal and pelvic swelling, mass and lump: Secondary | ICD-10-CM | POA: Diagnosis not present

## 2017-11-08 DIAGNOSIS — R918 Other nonspecific abnormal finding of lung field: Secondary | ICD-10-CM | POA: Diagnosis not present

## 2017-11-08 LAB — CMP (CANCER CENTER ONLY)
ALBUMIN: 3.9 g/dL (ref 3.5–5.0)
ALT: 20 U/L (ref 0–55)
AST: 22 U/L (ref 5–34)
Alkaline Phosphatase: 74 U/L (ref 40–150)
Anion gap: 7 (ref 3–11)
BUN: 12 mg/dL (ref 7–26)
CO2: 26 mmol/L (ref 22–29)
CREATININE: 0.81 mg/dL (ref 0.60–1.10)
Calcium: 9.2 mg/dL (ref 8.4–10.4)
Chloride: 108 mmol/L (ref 98–109)
GFR, Est AFR Am: >60 mL/min (ref >60)
Glucose, Bld: 81 mg/dL (ref 70–140)
POTASSIUM: 4.7 mmol/L (ref 3.3–4.7)
Sodium: 141 mmol/L (ref 136–145)
TOTAL PROTEIN: 7 g/dL (ref 6.4–8.3)

## 2017-11-08 LAB — CBC WITH DIFFERENTIAL (CANCER CENTER ONLY)
BASOS PCT: 1 %
Basophils Absolute: 0.1 10*3/uL (ref 0.0–0.1)
EOS ABS: 0.2 10*3/uL (ref 0.0–0.5)
EOS PCT: 2 %
HCT: 42.4 % (ref 34.8–46.6)
HEMOGLOBIN: 14 g/dL (ref 11.6–15.9)
Lymphocytes Relative: 42 %
Lymphs Abs: 3.6 10*3/uL — ABNORMAL HIGH (ref 0.9–3.3)
MCH: 31.2 pg (ref 25.1–34.0)
MCHC: 33 g/dL (ref 31.5–36.0)
MCV: 94.4 fL (ref 79.5–101.0)
MONOS PCT: 7 %
Monocytes Absolute: 0.6 10*3/uL (ref 0.1–0.9)
NEUTROS PCT: 48 %
Neutro Abs: 4.2 10*3/uL (ref 1.5–6.5)
PLATELETS: 221 10*3/uL (ref 145–400)
RBC: 4.49 MIL/uL (ref 3.70–5.45)
RDW: 13.5 % (ref 11.2–16.1)
WBC: 8.7 10*3/uL (ref 3.9–10.3)

## 2017-11-08 MED ORDER — IOPAMIDOL (ISOVUE-300) INJECTION 61%
INTRAVENOUS | Status: AC
  Filled 2017-11-08: qty 100

## 2017-11-08 MED ORDER — IOPAMIDOL (ISOVUE-300) INJECTION 61%
100.0000 mL | Freq: Once | INTRAVENOUS | Status: AC | PRN
  Administered 2017-11-08: 100 mL via INTRAVENOUS

## 2018-02-21 ENCOUNTER — Ambulatory Visit (INDEPENDENT_AMBULATORY_CARE_PROVIDER_SITE_OTHER): Payer: Managed Care, Other (non HMO) | Admitting: Cardiovascular Disease

## 2018-02-21 ENCOUNTER — Encounter: Payer: Self-pay | Admitting: Cardiovascular Disease

## 2018-02-21 VITALS — BP 110/72 | HR 74 | Ht 66.0 in | Wt 173.0 lb

## 2018-02-21 DIAGNOSIS — R0789 Other chest pain: Secondary | ICD-10-CM

## 2018-02-21 DIAGNOSIS — Z0000 Encounter for general adult medical examination without abnormal findings: Secondary | ICD-10-CM

## 2018-02-21 DIAGNOSIS — R002 Palpitations: Secondary | ICD-10-CM | POA: Diagnosis not present

## 2018-02-21 NOTE — Progress Notes (Signed)
02/21/2018 Holly Melton   1956/05/29  [id number]  Primary Physician Althisar, Mallie Mussel, PA-C Primary Cardiologist: Lorretta Harp MD Garret Reddish, [place], [place]  HPI:  Holly Melton is a 61 y.o.  mildly overweight separated (for the last 30 years) white female mother of one child who recently relocated from [place] to [place] and works at [employer] as a Librarian, academic". I last saw her in the office  12/16/2016. Her cardiovascular risk factor profile is notable for 40-pack-years of tobacco abuse currently smoking one pack per day. Otherwise there is no history of hypertension, diabetes or hyperlipidemia. There is no family history for heart disease. She is complaining of palpitations for years and occur fairly regularly but does admit to drinking 2-3 cups of coffee a day. She will episode of severe substernal chest pressure lasting 10-15 seconds approximately one week ago which subsided spontaneously. I obtained a 2-D echo which was normal, Myoview stress test which was low risk and nonischemic and inferior event monitor that showed short runs of PSVT. She has reduced her caffeine intake which has resulted in improvement in the frequency of her palpitations. Since I saw her a year ago she has remained asymptomatic.     Current Meds  Medication Sig  . B Complex-C (SUPER B COMPLEX PO) Take 1 tablet by mouth daily.  . Biotin 5000 MCG CAPS Take 1 capsule by mouth daily.  . Calcium Carbonate-Vit D-Min (HM CALCIUM 600+D PLUS MINERALS PO) Take by mouth.  . co-enzyme Q-10 30 MG capsule Take 1 capsule (30 mg total) by mouth 3 (three) times daily.  Marland Kitchen glucosamine-chondroitin 500-400 MG tablet Take 1 tablet by mouth 3 (three) times daily.     Allergies  Allergen Reactions  . Indomethacin     Social History   Socioeconomic History  . Marital status: Married    Spouse name: Not on file  . Number of children: 1  . Years of education: Not on file  . Highest education level: Not on file    Occupational History  . Not on file  Social Needs  . Financial resource strain: Not on file  . Food insecurity:    Worry: Not on file    Inability: Not on file  . Transportation needs:    Medical: Not on file    Non-medical: Not on file  Tobacco Use  . Smoking status: Current Every Day Smoker    Packs/day: 0.75    Years: 45.00    Pack years: 33.75  . Smokeless tobacco: Never Used  Substance and Sexual Activity  . Alcohol use: No  . Drug use: Not on file  . Sexual activity: Not on file  Lifestyle  . Physical activity:    Days per week: Not on file    Minutes per session: Not on file  . Stress: Not on file  Relationships  . Social connections:    Talks on phone: Not on file    Gets together: Not on file    Attends religious service: Not on file    Active member of club or organization: Not on file    Attends meetings of clubs or organizations: Not on file    Relationship status: Not on file  . Intimate partner violence:    Fear of current or ex partner: Not on file    Emotionally abused: Not on file    Physically abused: Not on file    Forced sexual activity: Not on file  Other Topics Concern  .  Not on file  Social History Narrative  . Not on file     Review of Systems: General: negative for chills, fever, night sweats or weight changes.  Cardiovascular: negative for chest pain, dyspnea on exertion, edema, orthopnea, palpitations, paroxysmal nocturnal dyspnea or shortness of breath Dermatological: negative for rash Respiratory: negative for cough or wheezing Urologic: negative for hematuria Abdominal: negative for nausea, vomiting, diarrhea, bright red blood per rectum, melena, or hematemesis Neurologic: negative for visual changes, syncope, or dizziness All other systems reviewed and are otherwise negative except as noted above.    Blood pressure 110/72, pulse 74, height 5\' 6"  (1.676 m), weight 173 lb (78.5 kg).  General appearance: alert and no  distress Neck: no adenopathy, no carotid bruit, no JVD, supple, symmetrical, trachea midline and thyroid not enlarged, symmetric, no tenderness/mass/nodules Lungs: clear to auscultation bilaterally Heart: regular rate and rhythm, S1, S2 normal, no murmur, click, rub or gallop Extremities: extremities normal, atraumatic, no cyanosis or edema Pulses: 2+ and symmetric Skin: Skin color, texture, turgor normal. No rashes or lesions Neurologic: Alert and oriented X 3, normal strength and tone. Normal symmetric reflexes. Normal coordination and gait  EKG sinus rhythm at 74 without ST or T wave changes.  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  Atypical chest pain History of atypical chest pain which has resolved normal Myoview stress test in the past as well.      Lorretta Harp MD FACP,FACC,FAHA, [facility] 02/21/2018 2:09 PM

## 2018-02-21 NOTE — Assessment & Plan Note (Signed)
History of atypical chest pain which has resolved normal Myoview stress test in the past as well.

## 2018-02-21 NOTE — Assessment & Plan Note (Signed)
History of palpitations found to be PSVT on event monitoring improved with cessation of caffeine.

## 2018-02-21 NOTE — Patient Instructions (Signed)
Medication Instructions: Your physician recommends that you continue on your current medications as directed. Please refer to the Current Medication list given to you today.   Follow-Up: Your physician recommends that you schedule a follow-up appointment as needed with Dr. Berry.    

## 2018-03-14 ENCOUNTER — Other Ambulatory Visit: Payer: Self-pay | Admitting: Obstetrics and Gynecology

## 2018-03-14 DIAGNOSIS — Z1231 Encounter for screening mammogram for malignant neoplasm of breast: Secondary | ICD-10-CM

## 2018-04-05 ENCOUNTER — Ambulatory Visit
Admission: RE | Admit: 2018-04-05 | Discharge: 2018-04-05 | Disposition: A | Discharge status: Home / Self Care | Payer: Managed Care, Other (non HMO) | Source: Ambulatory Visit | Attending: Obstetrics and Gynecology | Admitting: Obstetrics and Gynecology

## 2018-04-05 DIAGNOSIS — Z1231 Encounter for screening mammogram for malignant neoplasm of breast: Secondary | ICD-10-CM

## 2018-06-19 ENCOUNTER — Encounter: Payer: Self-pay | Admitting: Cardiovascular Disease

## 2018-06-19 ENCOUNTER — Ambulatory Visit (INDEPENDENT_AMBULATORY_CARE_PROVIDER_SITE_OTHER): Payer: Managed Care, Other (non HMO) | Admitting: Cardiovascular Disease

## 2018-06-19 DIAGNOSIS — R0789 Other chest pain: Secondary | ICD-10-CM

## 2018-06-19 DIAGNOSIS — Z720 Tobacco use: Secondary | ICD-10-CM

## 2018-06-19 NOTE — Assessment & Plan Note (Signed)
History of continued tobacco abuse of 1 pack/day recalcitrant risk factor modification.

## 2018-06-19 NOTE — Patient Instructions (Signed)

## 2018-06-19 NOTE — Progress Notes (Signed)
06/19/2018 Holly Melton   1956/06/30  [id number]  Primary Physician Althisar, Mallie Mussel, PA-C Primary Cardiologist: Lorretta Harp MD Garret Reddish, [place], [place]  HPI:  Holly Melton is a 62 y.o.  mildly overweight separated (for the last 25 years) white female mother of one child who recently relocated from [place] to [place] and works at [employer] as a Librarian, academic". I last saw her in the office  02/21/2018. Her cardiovascular risk factor profile is notable for 40-pack-years of tobacco abuse currently smoking one pack per day. Otherwise there is no history of hypertension, diabetes or hyperlipidemia. There is no family history for heart disease. She is complaining of palpitations for years and occur fairly regularly but does admit to drinking 2-3 cups of coffee a day. She will episode of severe substernal chest pressure lasting 10-15 seconds approximately one week ago which subsided spontaneously. I obtained a 2-D echo which was normal, Myoview stress test which was low risk and nonischemic and inferior event monitor that showed short runs of PSVT. She has reduced her caffeine intake which has resulted in improvement in the frequency of her palpitations.  Since I saw her in the office 4 months ago her 70 year old daughter has become chronically ill.  She is under a lot of stress has what sounds like panic attacks manifesting as chest tightness.  Do not think this represents ischemic symptoms.    Current Meds  Medication Sig  . B Complex-C (SUPER B COMPLEX PO) Take 1 tablet by mouth daily.  . Biotin 5000 MCG CAPS Take 1 capsule by mouth daily.  . Calcium Carbonate-Vit D-Min (HM CALCIUM 600+D PLUS MINERALS PO) Take by mouth.  . co-enzyme Q-10 30 MG capsule Take 1 capsule (30 mg total) by mouth 3 (three) times daily.  Marland Kitchen glucosamine-chondroitin 500-400 MG tablet Take 1 tablet by mouth 3 (three) times daily.     Allergies  Allergen Reactions  . Indomethacin     Social History    Socioeconomic History  . Marital status: Married    Spouse name: Not on file  . Number of children: 1  . Years of education: Not on file  . Highest education level: Not on file  Occupational History  . Not on file  Social Needs  . Financial resource strain: Not on file  . Food insecurity:    Worry: Not on file    Inability: Not on file  . Transportation needs:    Medical: Not on file    Non-medical: Not on file  Tobacco Use  . Smoking status: Current Every Day Smoker    Packs/day: 0.75    Years: 45.00    Pack years: 33.75  . Smokeless tobacco: Never Used  Substance and Sexual Activity  . Alcohol use: No  . Drug use: Not on file  . Sexual activity: Not on file  Lifestyle  . Physical activity:    Days per week: Not on file    Minutes per session: Not on file  . Stress: Not on file  Relationships  . Social connections:    Talks on phone: Not on file    Gets together: Not on file    Attends religious service: Not on file    Active member of club or organization: Not on file    Attends meetings of clubs or organizations: Not on file    Relationship status: Not on file  . Intimate partner violence:    Fear of current or ex partner: Not  on file    Emotionally abused: Not on file    Physically abused: Not on file    Forced sexual activity: Not on file  Other Topics Concern  . Not on file  Social History Narrative  . Not on file     Review of Systems: General: negative for chills, fever, night sweats or weight changes.  Cardiovascular: negative for chest pain, dyspnea on exertion, edema, orthopnea, palpitations, paroxysmal nocturnal dyspnea or shortness of breath Dermatological: negative for rash Respiratory: negative for cough or wheezing Urologic: negative for hematuria Abdominal: negative for nausea, vomiting, diarrhea, bright red blood per rectum, melena, or hematemesis Neurologic: negative for visual changes, syncope, or dizziness All other systems reviewed  and are otherwise negative except as noted above.    Blood pressure 138/85, pulse 86, height 5\' 6"  (1.676 m), weight 162 lb 6.4 oz (73.7 kg), SpO2 99 %.  General appearance: alert and no distress Neck: no adenopathy, no carotid bruit, no JVD, supple, symmetrical, trachea midline and thyroid not enlarged, symmetric, no tenderness/mass/nodules Lungs: clear to auscultation bilaterally Heart: regular rate and rhythm, S1, S2 normal, no murmur, click, rub or gallop Extremities: extremities normal, atraumatic, no cyanosis or edema Pulses: 2+ and symmetric Skin: Skin color, texture, turgor normal. No rashes or lesions Neurologic: Alert and oriented X 3, normal strength and tone. Normal symmetric reflexes. Normal coordination and gait  EKG not performed today  ASSESSMENT AND PLAN:   Atypical chest pain History of atypical chest pain without low risk Myoview and normal 2D echo.  She is under a lot of stress because of her young 67 year old daughter who is chronically ill.  Majority of her symptoms are now anxiety mediated.  Tobacco abuse History of continued tobacco abuse of 1 pack/day recalcitrant risk factor modification.      Lorretta Harp MD FACP,FACC,FAHA, [facility] 06/19/2018 3:14 PM

## 2018-06-19 NOTE — Assessment & Plan Note (Signed)
History of atypical chest pain without low risk Myoview and normal 2D echo.  She is under a lot of stress because of her young 61 year old daughter who is chronically ill.  Majority of her symptoms are now anxiety mediated.

## 2018-06-21 ENCOUNTER — Ambulatory Visit: Payer: Managed Care, Other (non HMO) | Admitting: Physician Assistant

## 2018-06-30 ENCOUNTER — Other Ambulatory Visit: Payer: Self-pay

## 2018-06-30 ENCOUNTER — Ambulatory Visit (INDEPENDENT_AMBULATORY_CARE_PROVIDER_SITE_OTHER): Payer: Managed Care, Other (non HMO)

## 2018-06-30 ENCOUNTER — Ambulatory Visit (HOSPITAL_COMMUNITY)
Admission: EM | Admit: 2018-06-30 | Discharge: 2018-06-30 | Disposition: A | Discharge status: Home / Self Care | Payer: Managed Care, Other (non HMO) | Attending: Internal Medicine | Admitting: Internal Medicine

## 2018-06-30 ENCOUNTER — Encounter (HOSPITAL_COMMUNITY): Payer: Self-pay | Admitting: Emergency Medicine

## 2018-06-30 DIAGNOSIS — Z886 Allergy status to analgesic agent status: Secondary | ICD-10-CM | POA: Insufficient documentation

## 2018-06-30 DIAGNOSIS — Z79899 Other long term (current) drug therapy: Secondary | ICD-10-CM | POA: Insufficient documentation

## 2018-06-30 DIAGNOSIS — R05 Cough: Secondary | ICD-10-CM | POA: Diagnosis not present

## 2018-06-30 DIAGNOSIS — F17210 Nicotine dependence, cigarettes, uncomplicated: Secondary | ICD-10-CM | POA: Diagnosis not present

## 2018-06-30 DIAGNOSIS — Z8542 Personal history of malignant neoplasm of other parts of uterus: Secondary | ICD-10-CM | POA: Insufficient documentation

## 2018-06-30 DIAGNOSIS — Z9012 Acquired absence of left breast and nipple: Secondary | ICD-10-CM | POA: Insufficient documentation

## 2018-06-30 DIAGNOSIS — Z90710 Acquired absence of both cervix and uterus: Secondary | ICD-10-CM | POA: Diagnosis not present

## 2018-06-30 DIAGNOSIS — B9789 Other viral agents as the cause of diseases classified elsewhere: Secondary | ICD-10-CM | POA: Diagnosis not present

## 2018-06-30 DIAGNOSIS — J069 Acute upper respiratory infection, unspecified: Secondary | ICD-10-CM

## 2018-06-30 DIAGNOSIS — R059 Cough, unspecified: Secondary | ICD-10-CM

## 2018-06-30 DIAGNOSIS — Z853 Personal history of malignant neoplasm of breast: Secondary | ICD-10-CM | POA: Diagnosis not present

## 2018-06-30 DIAGNOSIS — J029 Acute pharyngitis, unspecified: Secondary | ICD-10-CM | POA: Diagnosis present

## 2018-06-30 LAB — POCT RAPID STREP A: Streptococcus, Group A Screen (Direct): NEGATIVE

## 2018-06-30 MED ORDER — DEXAMETHASONE SODIUM PHOSPHATE 10 MG/ML IJ SOLN
10.0000 mg | Freq: Once | INTRAMUSCULAR | Status: AC
Start: 2018-06-30 — End: 2018-06-30
  Administered 2018-06-30: 10 mg via INTRAMUSCULAR

## 2018-06-30 MED ORDER — BENZONATATE 150 MG PO CAPS: 150.0000 mg | ORAL_CAPSULE | Freq: Three times a day (TID) | ORAL | 0 refills | Status: DC

## 2018-06-30 MED ORDER — PREDNISOLONE SODIUM PHOSPHATE 25 MG/5ML PO SOLN: 50.0000 mg | Freq: Every day | ORAL | 0 refills | Status: AC

## 2018-06-30 MED ORDER — DEXAMETHASONE SODIUM PHOSPHATE 10 MG/ML IJ SOLN
INTRAMUSCULAR | Status: AC
  Filled 2018-06-30: qty 1

## 2018-06-30 NOTE — Discharge Instructions (Signed)
Chest xray normal Continue course of doxycycline Continue to use albuterol inhaler as needed Tessalon for cough Prednisolone daily for 5 days  Please go to emergency room if developing worsening chest tightness, cough, shortness of breath, difficulty breathing, fever

## 2018-06-30 NOTE — ED Triage Notes (Signed)
Patient has been to [facility] 3 times in the past 2 weeks.  Patient has had a z-pack.  Completed this.   Patient is taking doxycycline.    Yesterday was seen again at [facility] and received breathing treatments.  They prescribed inhalers.  Albuterol and breo inhalers.  Says breo makes her feel worse.    Throat red, inflammation in chest, post nasal drip.  Facial swelling.  Feels like inflammation in chest and face

## 2018-06-30 NOTE — ED Provider Notes (Signed)
[place]    CSN: [id number] Arrival date & time: 06/30/18  1019     History   Chief Complaint Chief Complaint  Patient presents with  . URI    HPI Holly Melton is a 62 y.o. female history of tobacco use presenting today for evaluation of sore throat and chest discomfort.  Patient states that for the past 2 weeks she has had a dry cough, feels swelling in her throat and difficulty breathing.  She has also had nasal congestion and a sore throat.  She feels as if she needs to cough, but is having difficulty coughing.  Patient does have a significant smoking history, approximately 40-pack-year.  Denies history of COPD.  Denies history of hypertension, hyperlipidemia, DM.  Denies chest pain.  Denies shortness of breath.  Denies leg pain or leg swelling.  She was seen approximately 2 weeks ago and treated with a azithromycin, since she has recently been seen again and was started on doxycycline, she is taking about 5 to 6 days of this, she has not noticed any improvements.  She was seen yesterday and provided albuterol as well as Breo, but has not felt improvement with this as well.  Feels like her throat is inflamed and feels like she has swelling in her throat and nasal airways.  Denies wheezing.  HPI  Past Medical History:  Diagnosis Date  . Atypical chest pain   . Breast cancer ([place]) 2010  . Endometrial cancer ([place])   . Palpitations   . Personal history of chemotherapy   . Tobacco abuse     Patient Active Problem List   Diagnosis Date Noted  . Tobacco abuse 12/16/2016  . Mass of right axilla 12/29/2015  . Palpitations 10/13/2015  . Atypical chest pain 10/13/2015  . Genetic testing 10/06/2014  . Breast cancer of upper-outer quadrant of left female breast ([place]) 08/20/2014    Past Surgical History:  Procedure Laterality Date  . ABDOMINAL HYSTERECTOMY  2012   TAH/BSO  . AUGMENTATION MAMMAPLASTY Bilateral 2011  . CESAREAN SECTION  1994  . KNEE SURGERY Left 1999    . MASTECTOMY Left 2010    OB History   None      Home Medications    Prior to Admission medications   Medication Sig Start Date End Date Taking? Authorizing Provider  ALBUTEROL IN Inhale into the lungs.   Yes [provider]  B Complex-C (SUPER B COMPLEX PO) Take 1 tablet by mouth daily.   Yes [provider]  Biotin 5000 MCG CAPS Take 1 capsule by mouth daily.   Yes [provider]  Calcium Carbonate-Vit D-Min (HM CALCIUM 600+D PLUS MINERALS PO) Take by mouth.   Yes [provider]  co-enzyme Q-10 30 MG capsule Take 1 capsule (30 mg total) by mouth 3 (three) times daily. 03/23/16  Yes Magrinat, Virgie Dad, MD  doxycycline (VIBRAMYCIN) 100 MG capsule Take 100 mg by mouth 2 (two) times daily.   Yes [provider]  Fluticasone Furoate-Vilanterol (BREO ELLIPTA IN) Inhale into the lungs.   Yes [provider]  glucosamine-chondroitin 500-400 MG tablet Take 1 tablet by mouth 3 (three) times daily.   Yes [provider]  guaiFENesin (MUCINEX) 600 MG 12 hr tablet Take by mouth 2 (two) times daily.   Yes [provider]  ibuprofen (ADVIL,MOTRIN) 200 MG tablet Take 200 mg by mouth every 6 (six) hours as needed.   Yes [provider]  Benzonatate 150 MG CAPS Take  1 capsule (150 mg total) by mouth every 8 (eight) hours. 06/30/18   Lynnetta Tom C, PA-C  prednisoLONE Sodium Phosphate 25 MG/5ML SOLN Take 50 mg by mouth daily for 5 days. 06/30/18 07/05/18  Antolin Belsito, Elesa Hacker, PA-C    Family History Family History  Problem Relation Age of Onset  . Multiple myeloma Mother 58  . Colon polyps Father   . Thyroid nodules Father   . Melanoma Sister 46       has had 2-3 times  . Goiter Paternal Aunt   . Liver cancer Maternal Grandmother 5  . Thyroid cancer Sister   . Asperger's syndrome Other   . Breast cancer Cousin 39       triple negative    Social History Social History   Tobacco Use  . Smoking status:  Current Every Day Smoker    Packs/day: 0.75    Years: 45.00    Pack years: 33.75    Types: E-cigarettes, Cigarettes  . Smokeless tobacco: Never Used  . Tobacco comment: quit 2 weeks ago  Substance Use Topics  . Alcohol use: Yes  . Drug use: Never     Allergies   Indomethacin   Review of Systems Review of Systems  Constitutional: Negative for activity change, appetite change, chills, fatigue and fever.  HENT: Positive for congestion, rhinorrhea, sinus pressure and sore throat. Negative for ear pain and trouble swallowing.   Eyes: Negative for discharge and redness.  Respiratory: Positive for cough. Negative for chest tightness and shortness of breath.   Cardiovascular: Negative for chest pain.  Gastrointestinal: Negative for abdominal pain, diarrhea, nausea and vomiting.  Musculoskeletal: Negative for myalgias.  Skin: Negative for rash.  Neurological: Negative for dizziness, light-headedness and headaches.     Physical Exam Triage Vital Signs ED Triage Vitals  Enc Vitals Group     BP 06/30/18 1140 132/78     Pulse Rate 06/30/18 1140 77     Resp 06/30/18 1140 20     Temp 06/30/18 1140 98.9 F (37.2 C)     Temp Source 06/30/18 1140 Oral     SpO2 06/30/18 1140 100 %     Weight --      Height --      Head Circumference --      Peak Flow --      Pain Score 06/30/18 1136 0     Pain Loc --      Pain Edu? --      Excl. in [place]? --    No data found.  Updated Vital Signs BP 132/78 (BP Location: Left Arm)   Pulse 77   Temp 98.9 F (37.2 C) (Oral)   Resp 20   SpO2 100%   Visual Acuity Right Eye Distance:   Left Eye Distance:   Bilateral Distance:    Right Eye Near:   Left Eye Near:    Bilateral Near:     Physical Exam  Constitutional: She appears well-developed and well-nourished. No distress.  HENT:  Head: Normocephalic and atraumatic.  Bilateral ears without tenderness to palpation of external auricle, tragus and mastoid, EAC's without erythema or  swelling, TM's with good bony landmarks and cone of light. Non erythematous.  Oral mucosa pink and moist, no tonsillar enlargement or exudate.  Soft palate significantly erythematous, no swelling, no white patches or plaques.  Posterior pharynx patent and erythematous, no uvula deviation or swelling. Normal phonation.  Eyes: Conjunctivae are normal.  Neck: Neck supple.  Cardiovascular: Normal rate  and regular rhythm.  No murmur heard. Pulmonary/Chest: Effort normal and breath sounds normal. No respiratory distress.  Infrequent cough in room, Breathing comfortably at rest, CTABL, no wheezing, rales or other adventitious sounds auscultated  Abdominal: Soft. There is no tenderness.  Musculoskeletal: She exhibits no edema.  Neurological: She is alert.  Skin: Skin is warm and dry.  Psychiatric: She has a normal mood and affect.  Nursing note and vitals reviewed.    UC Treatments / Results  Labs (all labs ordered are listed, but only abnormal results are displayed) Labs Reviewed  CULTURE, GROUP A STREP [facility])  POCT RAPID STREP A    EKG None  Radiology Dg Chest 2 View  Result Date: 06/30/2018 CLINICAL DATA:  Patient states that she's had a cough and sob x 2 weeks with chest tightness. Not improving with antibiotics. Non productive cough. Current smoker- 3/4 ppd. Hx of pneumonia. Hx of left side mastectomy. EXAM: CHEST - 2 VIEW COMPARISON:  Chest CT, 11/08/2017.  Chest radiographs, 05/26/2017. FINDINGS: Cardiac silhouette is normal in size and configuration. No mediastinal or hilar masses. No evidence of adenopathy. Lungs are mildly hyperexpanded but clear. No pleural effusion or pneumothorax. There are changes from left breast surgery. Skeletal structures are intact. IMPRESSION: No active cardiopulmonary disease. Electronically Signed   By: Lajean Manes M.D.   On: 06/30/2018 12:08    Procedures Procedures (including critical care time)  Medications Ordered in UC Medications   dexamethasone (DECADRON) injection 10 mg (10 mg Intramuscular Given 06/30/18 1240)    Initial Impression / Assessment and Plan / UC Course  I have reviewed the triage vital signs and the nursing notes.  Pertinent labs & imaging results that were available during my care of the patient were reviewed by me and considered in my medical decision making (see chart for details).     Strep negative, chest x-ray negative.  Patient does have significantly erythematous mucosa, patient has only used Breo once.  No white plaques concerning for thrush.  Vital signs stable.  Given combination of sore throat and congestion/URI symptoms, feel PE less likely.  Patient is a smoker putting her at risk for this, but vital signs stable, no tachycardia or hypoxia.  Will treat patient with Decadron in clinic, continue prednisone for the next couple of days.  Complete course of doxycycline.  Would expect this to cover for infectious causes and lungs and sinuses.  Continue to monitor her symptoms, go to emergency room if developing worsening chest discomfort, shortness of breath, difficulty breathing. Final Clinical Impressions(s) / UC Diagnoses   Final diagnoses:  Cough  Viral upper respiratory tract infection     Discharge Instructions     Chest xray normal Continue course of doxycycline Continue to use albuterol inhaler as needed Tessalon for cough Prednisolone daily for 5 days  Please go to emergency room if developing worsening chest tightness, cough, shortness of breath, difficulty breathing, fever     ED Prescriptions    Medication Sig Dispense Auth. Provider   Benzonatate 150 MG CAPS Take 1 capsule (150 mg total) by mouth every 8 (eight) hours. 42 capsule Anyra Kaufman C, PA-C   prednisoLONE Sodium Phosphate 25 MG/5ML SOLN Take 50 mg by mouth daily for 5 days. 50 mL Stefannie Defeo C, PA-C     Controlled Substance Prescriptions [place] Controlled Substance Registry consulted? Not Applicable    Janith Lima, [place] 06/30/18 1431

## 2018-07-02 LAB — CULTURE, GROUP A STREP (THRC)

## 2018-09-24 ENCOUNTER — Ambulatory Visit (INDEPENDENT_AMBULATORY_CARE_PROVIDER_SITE_OTHER): Payer: Managed Care, Other (non HMO) | Admitting: Allergy

## 2018-09-24 ENCOUNTER — Encounter: Payer: Self-pay | Admitting: Allergy

## 2018-09-24 VITALS — BP 122/78 | HR 68 | Resp 14 | Ht 66.0 in | Wt 162.0 lb

## 2018-09-24 DIAGNOSIS — J3089 Other allergic rhinitis: Secondary | ICD-10-CM

## 2018-09-24 MED ORDER — AZELASTINE HCL 0.1 % NA SOLN: 2.0000 | Freq: Two times a day (BID) | NASAL | 5 refills | Status: DC | PRN

## 2018-09-24 MED ORDER — FLUTICASONE PROPIONATE 50 MCG/ACT NA SUSP: 2.0000 | Freq: Every day | NASAL | 5 refills | Status: AC

## 2018-09-24 NOTE — Assessment & Plan Note (Signed)
Perennial rhinitis symptoms for many years but worse the last few months.  She was evaluated by ENT and started on Flonase 2 sprays once a day 3 weeks ago and noticed some benefit.  Unable to skin test today due to recent antihistamine intake.  Get bloodwork and will make additional recommendations based on results.  May use over the counter antihistamines such as Zyrtec (cetirizine), Claritin (loratadine), Allegra (fexofenadine), or Xyzal (levocetirizine) daily as needed.  Continue flonase 2 sprays daily.  Start azelastine 1-2 sprays twice a day as needed for post nasal drip.

## 2018-09-24 NOTE — Progress Notes (Signed)
New Patient Note  RE: Holly Melton MRN: [id number] DOB: [birth year] Date of Office Visit: 09/24/2018  Referring provider: Gaye Alken, PA-C Primary care provider: Brigitte Pulse, DO  Chief Complaint: New Patient (Initial Visit) (allergies nasal congestion, rhinitis )  History of Present Illness: I had the pleasure of seeing Holly Melton for initial evaluation at the [facility] on 09/24/2018. She is a 62 y.o. female, who is self-referred here for the evaluation of nasal congestion.  Rhinitis: She reports symptoms of nasal congestion, PND, itchy nose. Symptoms have been going on for many years but worse the last few months. Anosmia: no. Headache: no. She has used Flonase 2 sprays once a day x 3 weeks and Claritin prn with fair improvement in symptoms. Sinus infections: none. Previous work up includes: none. Previous ENT evaluation: yes.  Currently on Protonix '40mg'$  daily for reflux with some benefit. She does follow with GI.   Assessment and Plan: Holly Melton is a 62 y.o. female with: Other allergic rhinitis Perennial rhinitis symptoms for many years but worse the last few months.  She was evaluated by ENT and started on Flonase 2 sprays once a day 3 weeks ago and noticed some benefit.  Unable to skin test today due to recent antihistamine intake.  Get bloodwork and will make additional recommendations based on results.  May use over the counter antihistamines such as Zyrtec (cetirizine), Claritin (loratadine), Allegra (fexofenadine), or Xyzal (levocetirizine) daily as needed.  Continue flonase 2 sprays daily.  Start azelastine 1-2 sprays twice a day as needed for post nasal drip.  Return in about 2 months (around 11/25/2018).  Meds ordered this encounter  Medications  . azelastine (ASTELIN) 0.1 % nasal spray    Sig: Place 2 sprays into both nostrils 2 (two) times daily as needed for rhinitis. Use in each nostril as directed    Dispense:  30 mL   Refill:  5  . fluticasone (FLONASE) 50 MCG/ACT nasal spray    Sig: Place 2 sprays into both nostrils daily.    Dispense:  16 g    Refill:  5    Lab Orders     Allergens w/Total IgE Area 2  Other allergy screening: Asthma: no Rhino conjunctivitis: yes Food allergy: no Medication allergy: yes  Indomethacin - vomiting Hymenoptera allergy: no Urticaria: no Eczema:no History of recurrent infections suggestive of immunodeficency: no  Diagnostics: Skin Testing: Deferred due to recent antihistamines use.  Past Medical History: Patient Active Problem List   Diagnosis Date Noted  . Other allergic rhinitis 09/24/2018  . Tobacco abuse 12/16/2016  . Mass of right axilla 12/29/2015  . Palpitations 10/13/2015  . Atypical chest pain 10/13/2015  . Genetic testing 10/06/2014  . Breast cancer of upper-outer quadrant of left female breast ([place]) 08/20/2014   Past Medical History:  Diagnosis Date  . Atypical chest pain   . Breast cancer ([place]) 2010  . Eczema   . Endometrial cancer ([place])   . Palpitations   . Personal history of chemotherapy   . Tobacco abuse    Past Surgical History: Past Surgical History:  Procedure Laterality Date  . ABDOMINAL HYSTERECTOMY  2012   TAH/BSO  . AUGMENTATION MAMMAPLASTY Bilateral 2011  . CESAREAN SECTION  1994  . KNEE SURGERY Left 1999  . MASTECTOMY Left 2010   Medication List:  Current Outpatient Medications  Medication Sig Dispense Refill  . B Complex-C (SUPER B COMPLEX PO) Take 1 tablet by mouth daily.    Marland Kitchen  Biotin 5000 MCG CAPS Take 1 capsule by mouth daily.    . Calcium Carbonate-Vit D-Min (HM CALCIUM 600+D PLUS MINERALS PO) Take by mouth.    . clonazePAM (KLONOPIN) 0.5 MG tablet Take 0.5 mg by mouth 3 (three) times daily as needed.  0  . co-enzyme Q-10 30 MG capsule Take 1 capsule (30 mg total) by mouth 3 (three) times daily.    Marland Kitchen glucosamine-chondroitin 500-400 MG tablet Take 1 tablet by mouth 3 (three) times daily.    . pantoprazole  (PROTONIX) 40 MG tablet Take 40 mg by mouth daily.    Marland Kitchen albuterol (PROVENTIL HFA;VENTOLIN HFA) 108 (90 Base) MCG/ACT inhaler     . azelastine (ASTELIN) 0.1 % nasal spray Place 2 sprays into both nostrils 2 (two) times daily as needed for rhinitis. Use in each nostril as directed 30 mL 5  . Benzonatate 150 MG CAPS Take 1 capsule (150 mg total) by mouth every 8 (eight) hours. (Patient not taking: Reported on 09/24/2018) 42 capsule 0  . doxycycline (VIBRAMYCIN) 100 MG capsule Take 100 mg by mouth 2 (two) times daily.    . fluticasone (FLONASE) 50 MCG/ACT nasal spray Place 2 sprays into both nostrils daily. 16 g 5  . guaiFENesin (MUCINEX) 600 MG 12 hr tablet Take by mouth 2 (two) times daily.    Marland Kitchen ibuprofen (ADVIL,MOTRIN) 200 MG tablet Take 200 mg by mouth every 6 (six) hours as needed.     No current facility-administered medications for this visit.    Allergies: Allergies  Allergen Reactions  . Indomethacin    Social History: Social History   Socioeconomic History  . Marital status: Married    Spouse name: Not on file  . Number of children: 1  . Years of education: Not on file  . Highest education level: Not on file  Occupational History  . Not on file  Social Needs  . Financial resource strain: Not on file  . Food insecurity:    Worry: Not on file    Inability: Not on file  . Transportation needs:    Medical: Not on file    Non-medical: Not on file  Tobacco Use  . Smoking status: Former Smoker    Packs/day: 0.75    Years: 45.00    Pack years: 33.75    Types: E-cigarettes, Cigarettes    Last attempt to quit: 06/2018    Years since quitting: 0.2  . Smokeless tobacco: Never Used  . Tobacco comment: quit 2 weeks ago  Substance and Sexual Activity  . Alcohol use: Yes  . Drug use: Never  . Sexual activity: Not on file  Lifestyle  . Physical activity:    Days per week: Not on file    Minutes per session: Not on file  . Stress: Not on file  Relationships  . Social  connections:    Talks on phone: Not on file    Gets together: Not on file    Attends religious service: Not on file    Active member of club or organization: Not on file    Attends meetings of clubs or organizations: Not on file    Relationship status: Not on file  Other Topics Concern  . Not on file  Social History Narrative  . Not on file   Lives in a 2005 built home. Smoking: quit in September 2019, smoked for 30 years x 1 ppd Occupation: Customer service manager History: Water Damage/mildew in the house: no Charity fundraiser in the  family room: no Carpet in the bedroom: yes Heating: gas and electric Cooling: central Pet: yes 4 cats x 5 yrs  Family History: Family History  Problem Relation Age of Onset  . Multiple myeloma Mother 92  . Colon polyps Father   . Thyroid nodules Father   . Melanoma Sister 22       has had 2-3 times  . Goiter Paternal Aunt   . Liver cancer Maternal Grandmother 48  . Thyroid cancer Sister   . Asperger's syndrome Other   . Breast cancer Cousin 49       triple negative  . Asthma Neg Hx   . Eczema Neg Hx   . Urticaria Neg Hx    Problem                               Relation Asthma                                   No  Eczema                                Sister   Food allergy                          No  Allergic rhino conjunctivitis     Daughter   Review of Systems  Constitutional: Negative for appetite change, chills, fever and unexpected weight change.  HENT: Positive for congestion and postnasal drip. Negative for rhinorrhea.   Eyes: Negative for itching.  Respiratory: Negative for cough, chest tightness, shortness of breath and wheezing.   Cardiovascular: Negative for chest pain.  Gastrointestinal: Positive for constipation. Negative for abdominal pain.  Genitourinary: Negative for difficulty urinating.  Skin: Negative for rash.  Allergic/Immunologic: Positive for environmental allergies. Negative for food allergies.    Neurological: Negative for headaches.   Objective: BP 122/78 (BP Location: Right Arm, Patient Position: Sitting, Cuff Size: Normal)   Pulse 68   Resp 14   Ht '5\' 6"'$  (1.676 m)   Wt 162 lb (73.5 kg)   SpO2 98%   BMI 26.15 kg/m  Body mass index is 26.15 kg/m. Physical Exam  Constitutional: She is oriented to person, place, and time. She appears well-developed and well-nourished.  HENT:  Head: Normocephalic and atraumatic.  Right Ear: External ear normal.  Left Ear: External ear normal.  Nose: Nose normal.  Mouth/Throat: Oropharynx is clear and moist.  Eyes: Conjunctivae and EOM are normal.  Neck: Neck supple.  Cardiovascular: Normal rate, regular rhythm and normal heart sounds. Exam reveals no gallop and no friction rub.  No murmur heard. Pulmonary/Chest: Effort normal and breath sounds normal. She has no wheezes. She has no rales.  Abdominal: Soft.  Lymphadenopathy:    She has no cervical adenopathy.  Neurological: She is alert and oriented to person, place, and time.  Skin: Skin is warm. No rash noted.  Psychiatric: She has a normal mood and affect. Her behavior is normal.  Nursing note and vitals reviewed.  The plan was reviewed with the patient/family, and all questions/concerned were addressed.  It was my pleasure to see Holly Melton today and participate in her care. Please feel free to contact me with any questions or concerns.  Sincerely,  Rexene Alberts, DO Allergy &  Immunology  [facility] office: [phone] [place]

## 2018-09-24 NOTE — Patient Instructions (Addendum)
Get bloodwork and will make additional recommendations based on results.  May use over the counter antihistamines such as Zyrtec (cetirizine), Claritin (loratadine), Allegra (fexofenadine), or Xyzal (levocetirizine) daily as needed.  Continue flonase 2 sprays daily. Continue azelastine 1-2 sprays twice a day as needed for post nasal drip.  Follow up in 2 months.

## 2018-09-27 LAB — ALLERGENS W/TOTAL IGE AREA 2
Alternaria Alternata IgE: <0.1 kU/L
Cedar, Mountain IgE: <0.1 kU/L
Cockroach, German IgE: <0.1 kU/L
D Farinae IgE: <0.1 kU/L
D Pteronyssinus IgE: <0.1 kU/L
IGE (IMMUNOGLOBULIN E), SERUM: 65 [IU]/mL (ref 6–495)
Maple/Box Elder IgE: <0.1 kU/L
Mouse Urine IgE: <0.1 kU/L
Oak, White IgE: <0.1 kU/L
Pecan, Hickory IgE: <0.1 kU/L
Pigweed, Rough IgE: <0.1 kU/L
Sheep Sorrel IgE Qn: <0.1 kU/L

## 2018-09-28 ENCOUNTER — Encounter (HOSPITAL_COMMUNITY): Payer: Self-pay | Admitting: Emergency Medicine

## 2018-09-28 ENCOUNTER — Ambulatory Visit (HOSPITAL_COMMUNITY)
Admission: EM | Admit: 2018-09-28 | Discharge: 2018-09-28 | Disposition: A | Discharge status: Home / Self Care | Payer: Managed Care, Other (non HMO) | Attending: Internal Medicine | Admitting: Internal Medicine

## 2018-09-28 DIAGNOSIS — W268XXA Contact with other sharp object(s), not elsewhere classified, initial encounter: Secondary | ICD-10-CM | POA: Diagnosis not present

## 2018-09-28 DIAGNOSIS — S61215A Laceration without foreign body of left ring finger without damage to nail, initial encounter: Secondary | ICD-10-CM | POA: Insufficient documentation

## 2018-09-28 DIAGNOSIS — Z23 Encounter for immunization: Secondary | ICD-10-CM

## 2018-09-28 MED ORDER — TETANUS-DIPHTHERIA TOXOIDS TD 5-2 LFU IM INJ: 0.5000 mL | INJECTION | Freq: Once | INTRAMUSCULAR | Status: DC

## 2018-09-28 MED ORDER — TETANUS-DIPHTH-ACELL PERTUSSIS 5-2.5-18.5 LF-MCG/0.5 IM SUSP
INTRAMUSCULAR | Status: AC
  Filled 2018-09-28: qty 0.5

## 2018-09-28 MED ORDER — TETANUS-DIPHTH-ACELL PERTUSSIS 5-2.5-18.5 LF-MCG/0.5 IM SUSP
0.5000 mL | Freq: Once | INTRAMUSCULAR | Status: AC
  Administered 2018-09-28: 0.5 mL via INTRAMUSCULAR

## 2018-09-28 NOTE — ED Triage Notes (Signed)
Pt presents to [facility] for assessment of a laceration to the third digit of her left hand.  Patient states she cut it on a newly opened can.  Patient states today she knocked the finger at a christmas party at work and felt a "pop" under the bandaid and she noted blood.  Bleeding controlled at this time.  Last tetanus more than 5 years.

## 2018-09-28 NOTE — ED Provider Notes (Signed)
[place]    CSN: [id number] Arrival date & time: 09/28/18  1417     History   Chief Complaint Chief Complaint  Patient presents with  . Laceration    HPI Holly Melton is a 62 y.o. female.    Subjective:  Holly Melton is a 62 y.o. female who presents for evaluation of a laceration to the finger. Injury occurred 2 days ago. The mechanism of the wound was metal edge of can. The patient reports pain without any coldness, numbness or paresthesias in the affected area. There were no other injuries.  The tetanus status is more than 5 years ago.  Patient states that the bleeding was controlled with a Band-Aid over the past couple of days.  She is also applied Neosporin to the site.  Earlier this afternoon, patient accidentally hit her finger against something at work.  The wound reopened and started to bleed.  The following portions of the patient's history were reviewed and updated as appropriate: allergies, current medications, past family history, past medical history, past social history, past surgical history and problem list.       Past Medical History:  Diagnosis Date  . Atypical chest pain   . Breast cancer ([place]) 2010  . Eczema   . Endometrial cancer ([place])   . Palpitations   . Personal history of chemotherapy   . Tobacco abuse     Patient Active Problem List   Diagnosis Date Noted  . Other allergic rhinitis 09/24/2018  . Tobacco abuse 12/16/2016  . Mass of right axilla 12/29/2015  . Palpitations 10/13/2015  . Atypical chest pain 10/13/2015  . Genetic testing 10/06/2014  . Breast cancer of upper-outer quadrant of left female breast ([place]) 08/20/2014    Past Surgical History:  Procedure Laterality Date  . ABDOMINAL HYSTERECTOMY  2012   TAH/BSO  . AUGMENTATION MAMMAPLASTY Bilateral 2011  . CESAREAN SECTION  1994  . KNEE SURGERY Left 1999  . MASTECTOMY Left 2010    OB History   No obstetric history on file.      Home Medications     Prior to Admission medications   Medication Sig Start Date End Date Taking? Authorizing Provider  albuterol (PROVENTIL HFA;VENTOLIN HFA) 108 (90 Base) MCG/ACT inhaler  06/24/18   [provider]  azelastine (ASTELIN) 0.1 % nasal spray Place 2 sprays into both nostrils 2 (two) times daily as needed for rhinitis. Use in each nostril as directed 09/24/18   Garnet Sierras, DO  B Complex-C (SUPER B COMPLEX PO) Take 1 tablet by mouth daily.    [provider]  Benzonatate 150 MG CAPS Take 1 capsule (150 mg total) by mouth every 8 (eight) hours. Patient not taking: Reported on 09/24/2018 06/30/18   Wieters, Elesa Hacker, PA-C  Biotin 5000 MCG CAPS Take 1 capsule by mouth daily.    [provider]  Calcium Carbonate-Vit D-Min (HM CALCIUM 600+D PLUS MINERALS PO) Take by mouth.    [provider]  clonazePAM (KLONOPIN) 0.5 MG tablet Take 0.5 mg by mouth 3 (three) times daily as needed. 06/24/18   [provider]  co-enzyme Q-10 30 MG capsule Take 1 capsule (30 mg total) by mouth 3 (three) times daily. 03/23/16   Magrinat, Virgie Dad, MD  doxycycline (VIBRAMYCIN) 100 MG capsule Take 100 mg by mouth 2 (two) times daily.    [provider]  fluticasone (FLONASE) 50 MCG/ACT nasal spray Place 2 sprays into both nostrils daily. 09/24/18   Maudie Mercury,  Albin Felling, DO  glucosamine-chondroitin 500-400 MG tablet Take 1 tablet by mouth 3 (three) times daily.    [provider]  guaiFENesin (MUCINEX) 600 MG 12 hr tablet Take by mouth 2 (two) times daily.    [provider]  ibuprofen (ADVIL,MOTRIN) 200 MG tablet Take 200 mg by mouth every 6 (six) hours as needed.    [provider]  pantoprazole (PROTONIX) 40 MG tablet Take 40 mg by mouth daily.    [provider]    Family History Family History  Problem Relation Age of Onset  . Multiple myeloma Mother 53  . Colon polyps Father   . Thyroid nodules Father   . Melanoma Sister 33       has had  2-3 times  . Goiter Paternal Aunt   . Liver cancer Maternal Grandmother 40  . Thyroid cancer Sister   . Asperger's syndrome Other   . Breast cancer Cousin 49       triple negative  . Asthma Neg Hx   . Eczema Neg Hx   . Urticaria Neg Hx     Social History Social History   Tobacco Use  . Smoking status: Former Smoker    Packs/day: 0.75    Years: 45.00    Pack years: 33.75    Types: E-cigarettes, Cigarettes    Last attempt to quit: 06/2018    Years since quitting: 0.3  . Smokeless tobacco: Never Used  . Tobacco comment: quit 2 weeks ago  Substance Use Topics  . Alcohol use: Yes  . Drug use: Never     Allergies   Indomethacin   Review of Systems Review of Systems  Skin: Positive for wound.  All other systems reviewed and are negative.    Physical Exam Triage Vital Signs ED Triage Vitals  Enc Vitals Group     BP 09/28/18 1436 103/72     Pulse Rate 09/28/18 1436 85     Resp 09/28/18 1436 18     Temp 09/28/18 1436 99.5 F (37.5 C)     Temp Source 09/28/18 1436 Oral     SpO2 09/28/18 1436 99 %     Weight --      Height --      Head Circumference --      Peak Flow --      Pain Score 09/28/18 1437 10     Pain Loc --      Pain Edu? --      Excl. in [place]? --    No data found.  Updated Vital Signs BP 103/72 (BP Location: Right Arm)   Pulse 85   Temp 99.5 F (37.5 C) (Oral)   Resp 18   SpO2 99%   Visual Acuity Right Eye Distance:   Left Eye Distance:   Bilateral Distance:    Right Eye Near:   Left Eye Near:    Bilateral Near:     Physical Exam Constitutional:      Appearance: Normal appearance.  Neck:     Musculoskeletal: Normal range of motion and neck supple.  Cardiovascular:     Rate and Rhythm: Normal rate and regular rhythm.     Pulses: Normal pulses.  Musculoskeletal: Normal range of motion.  Skin:    General: Skin is warm and dry.     Findings: Laceration present.     Comments: There is an approximate 2 cm linear superficial  laceration noted to the distal portion of the left ring finger. Bleeding  controlled. Neurovascular intact.   Neurological:     General: No focal deficit present.     Mental Status: She is alert and oriented to person, place, and time.  Psychiatric:        Mood and Affect: Mood normal.      UC Treatments / Results  Labs (all labs ordered are listed, but only abnormal results are displayed) Labs Reviewed - No data to display  EKG None  Radiology No results found.  Procedures Laceration Repair Date/Time: 09/28/2018 3:48 PM Performed by: Enrique Sack, FNP Authorized by: Wynona Luna, MD   Consent:    Consent obtained:  Verbal   Consent given by:  Patient   Risks discussed:  Infection, pain, poor cosmetic result and poor wound healing   Alternatives discussed:  No treatment Anesthesia (see MAR for exact dosages):    Anesthesia method:  Local infiltration   Local anesthetic:  Lidocaine 1% w/o epi Laceration details:    Location:  Finger   Finger location:  L ring finger   Length (cm):  2 Repair type:    Repair type:  Simple Exploration:    Hemostasis achieved with:  Direct pressure and epinephrine   Wound exploration: wound explored through full range of motion     Contaminated: no   Treatment:    Area cleansed with:  Saline   Amount of cleaning:  Standard   Irrigation solution:  Sterile saline   Irrigation method:  Syringe Skin repair:    Repair method:  Steri-Strips   Number of Steri-Strips:  2 Approximation:    Approximation:  Close Post-procedure details:    Dressing:  Antibiotic ointment and non-adherent dressing   Patient tolerance of procedure:  Tolerated well, no immediate complications   (including critical care time)  Medications Ordered in UC Medications  tetanus & diphtheria toxoids (adult) (TENIVAC) injection 0.5 mL (has no administration in time range)    Initial Impression / Assessment and Plan / UC Course  I have reviewed the  triage vital signs and the nursing notes.  Pertinent labs & imaging results that were available during my care of the patient were reviewed by me and considered in my medical decision making (see chart for details).     62 year old female presenting for evaluation of a laceration to the left fourth finger.  Initial injury occurred 2 days ago.  Wound was cleansed and evaluated.  No indications of infection.  Laceration is about 2 cm in length but superficial.  Delayed primary closure not indicated.  Steri-Strips applied.   Plan:  Tetanus immunization given  Wound care discussed  Follow-up as needed   Today's evaluation has revealed no signs of a dangerous process. Discussed diagnosis with patient. Patient aware of their diagnosis, possible red flag symptoms to watch out for and need for close follow up. Patient understands verbal and written discharge instructions. Patient comfortable with plan and disposition.  Patient has a clear mental status at this time, good insight into illness (after discussion and teaching) and has clear judgment to make decisions regarding their care.  Documentation was completed with the aid of voice recognition software. Transcription may contain typographical errors.   Final Clinical Impressions(s) / UC Diagnoses   Final diagnoses:  Laceration of left ring finger without foreign body without damage to nail, initial encounter     Discharge Instructions     Your wound was thoroughly cleansed and closed using adhesive strips. Keep wound clean and dry. You can remove the  strips yourself in about 7-10 days.    ED Prescriptions    None     Controlled Substance Prescriptions [place] Controlled Substance Registry consulted? Not Applicable   Enrique Sack, [place] 09/28/18 1554

## 2018-09-28 NOTE — Discharge Instructions (Addendum)
Your wound was thoroughly cleansed and closed using adhesive strips. Keep wound clean and dry. You can remove the strips yourself in about 7-10 days.

## 2018-10-05 ENCOUNTER — Ambulatory Visit (INDEPENDENT_AMBULATORY_CARE_PROVIDER_SITE_OTHER): Payer: Managed Care, Other (non HMO) | Admitting: Family Medicine

## 2018-10-05 ENCOUNTER — Encounter: Payer: Self-pay | Admitting: Family Medicine

## 2018-10-05 VITALS — BP 96/66 | HR 68 | Resp 16 | Ht 66.0 in | Wt 162.0 lb

## 2018-10-05 DIAGNOSIS — J301 Allergic rhinitis due to pollen: Secondary | ICD-10-CM

## 2018-10-05 NOTE — Patient Instructions (Signed)
Allergic rhinitis Your environmental allergy skin testing was positive to ragweed Continue Xyzal 5 mg once a day as needed for a runny nose Continue Flonase 1-2 sprays in each nostril once a day as needed for a stuffy nose Allergen avoidance measures provided If your symptoms are not well controlled with these medications and avoidance measures consider allergy immunotherapy  Call the clinic with any questions or if your symptoms worsen or do not improve  Follow up in 3 months or sooner if needed

## 2018-10-05 NOTE — Progress Notes (Signed)
   [address]  [place] [address] Dept: [phone]  FOLLOW UP NOTE  Patient ID: Holly Melton, female    DOB: 12/03/1955  Age: 62 y.o. MRN: [id number] Date of Office Visit: 10/05/2018  Assessment  Chief Complaint: Allergy Testing  HPI Holly Melton is a 62 year old female who presents to the clinic for skin testing. She was last seen on 09/24/2018 by Dr. Maudie Mercury for evaluation of allergic rhinitis. At that time, skin testing was deferred due to recent antihistamine use and blood was collected to evaluate for environmental allergens and was negative in its entirety. She reports feeling well today and has not taken any antihistamines for the last 3 days. Her current medications are listed in the chart.   Drug Allergies:  Allergies  Allergen Reactions  . Indomethacin     Physical Exam: BP 96/66   Pulse 68   Resp 16   Ht 5\' 6"  (1.676 m)   Wt 162 lb (73.5 kg)   SpO2 96%   BMI 26.15 kg/m    Physical Exam Constitutional:      Appearance: Normal appearance.  HENT:     Head: Normocephalic and atraumatic.     Right Ear: Tympanic membrane normal.     Left Ear: Tympanic membrane normal.     Nose:     Comments: Bilateral nares normal. Pharynx normal. Ears normal. Eyes normal.    Mouth/Throat:     Pharynx: Oropharynx is clear.  Eyes:     Conjunctiva/sclera: Conjunctivae normal.  Neck:     Musculoskeletal: Normal range of motion and neck supple.  Cardiovascular:     Rate and Rhythm: Normal rate and regular rhythm.     Heart sounds: Normal heart sounds.  Pulmonary:     Effort: Pulmonary effort is normal.     Breath sounds: Normal breath sounds.     Comments: Lungs clear to auscultation Musculoskeletal: Normal range of motion.  Skin:    General: Skin is warm and dry.  Neurological:     Mental Status: She is alert and oriented to person, place, and time.  Psychiatric:        Mood and Affect: Mood normal.        Behavior: Behavior normal.        Thought Content:  Thought content normal.        Judgment: Judgment normal.     Diagnostics: Percutaneous environmental skin testing positive to ragweed with adequate controls. Intradermal environmental skin testing negative with adequate controls.   Assessment and Plan: 1. Seasonal allergic rhinitis due to pollen      Patient Instructions  Allergic rhinitis Your environmental allergy skin testing was positive to ragweed Continue Xyzal 5 mg once a day as needed for a runny nose Continue Flonase 1-2 sprays in each nostril once a day as needed for a stuffy nose Allergen avoidance measures provided If your symptoms are not well controlled with these medications and avoidance measures consider allergy immunotherapy  Call the clinic with any questions or if your symptoms worsen or do not improve  Follow up in 3 months or sooner if needed   Return in about 3 months (around 01/04/2019), or if symptoms worsen or fail to improve.    Thank you for the opportunity to care for this patient.  Please do not hesitate to contact me with questions.  Gareth Morgan, FNP [facility]

## 2018-10-30 ENCOUNTER — Telehealth: Payer: Self-pay | Admitting: Adult Health

## 2018-10-30 ENCOUNTER — Inpatient Hospital Stay: Payer: Managed Care, Other (non HMO) | Attending: Adult Health | Admitting: Adult Health

## 2018-10-30 VITALS — BP 123/81 | HR 70 | Temp 98.6°F | Resp 16 | Ht 66.0 in | Wt 164.8 lb

## 2018-10-30 DIAGNOSIS — D1779 Benign lipomatous neoplasm of other sites: Secondary | ICD-10-CM | POA: Insufficient documentation

## 2018-10-30 DIAGNOSIS — Z853 Personal history of malignant neoplasm of breast: Secondary | ICD-10-CM | POA: Diagnosis not present

## 2018-10-30 DIAGNOSIS — Z87891 Personal history of nicotine dependence: Secondary | ICD-10-CM | POA: Insufficient documentation

## 2018-10-30 DIAGNOSIS — R911 Solitary pulmonary nodule: Secondary | ICD-10-CM | POA: Insufficient documentation

## 2018-10-30 DIAGNOSIS — Z9012 Acquired absence of left breast and nipple: Secondary | ICD-10-CM | POA: Diagnosis not present

## 2018-10-30 DIAGNOSIS — C50412 Malignant neoplasm of upper-outer quadrant of left female breast: Secondary | ICD-10-CM

## 2018-10-30 DIAGNOSIS — D1730 Benign lipomatous neoplasm of skin and subcutaneous tissue of unspecified sites: Secondary | ICD-10-CM | POA: Diagnosis not present

## 2018-10-30 NOTE — Progress Notes (Signed)
CLINIC:  Survivorship   REASON FOR VISIT:  Routine follow-up for history of breast cancer.   BRIEF ONCOLOGIC HISTORY:   [place] relocated to [facility]  (1) status post left mastectomy and sentinel lymph node sampling April 2010 for an mpT1c pN0, stage IA invasive breast cancer, grade 2, estrogen receptor 96% positive, HER-2 amplified  (2) adjuvant chemotherapy consisted of dose dense cyclophosphamide and doxorubicin 4 followed by dose dense paclitaxel 4  (3) trastuzumab was initiated with the paclitaxel treatments and continued to complete a year (to August 2011)  (4) tamoxifen started October 2010 and continued through September 2012  (5) status post robotic TAH/BSO September 2012  (6) on anastrozole October 2012-2017             (a) mild osteopenia by bone density July 2013  (7) genetics testing  10/01/2014 through the [facility]Next gene panel offered by [employer] found no deleterious mutations in ATM, BARD1, BRCA1, BRCA2, BRIP1, CDH1, CHEK2, MRE11A, MUTYH, NBN, NF1, PALB2, PTEN, RAD50, RAD51C, RAD51D, and TP53. Genetic testing found two variants of uncertain significance,, MUTYH p.R474C and NBN c.2071A>C,  ADDITIONAL CONCERNS:  (a) small pulmonary nodules noted on prior CT scans             (i) Chest CT scan 12/22/2015 shows stability over several years  (b) nonspecific thyroid nodules noted on prior ultrasounds  (c) occipital bone lesion noted by prior brain MRI             (a) noncontrasted CT 01/26/2016 = no change as compared to November 2015--this is most consistent with fibrous dysplasia  (d) multiple lipomas syndrome  INTERVAL HISTORY:  Ms. Gatliff presents to the Survivorship Clinic today for routine follow-up for her history of breast cancer.  Overall, she reports feeling quite well. She had a rough year, with her daughter having an illness, they think is Crohns, and a perforated colon.  She was in the hospital for more than a month,  and Sergio has unfortunately developed reflux and anxiety with it.  She wants to know what to do about her lipomas as she has one on her arm that is getting larger faster than she thinks it should.        REVIEW OF SYSTEMS:  Review of Systems  Constitutional: Negative for appetite change, chills, fatigue, fever and unexpected weight change.  HENT:   Negative for hearing loss and lump/mass.   Eyes: Negative for eye problems and icterus.  Respiratory: Negative for chest tightness, cough and shortness of breath.   Cardiovascular: Negative for chest pain, leg swelling and palpitations.  Gastrointestinal: Negative for abdominal distention, abdominal pain, constipation, diarrhea, nausea and vomiting.  Endocrine: Negative for hot flashes.  Genitourinary: Negative for difficulty urinating.   Musculoskeletal: Negative for arthralgias.  Skin: Negative for itching and rash.  Neurological: Negative for dizziness, extremity weakness, headaches and numbness.  Hematological: Negative for adenopathy. Does not bruise/bleed easily.  Psychiatric/Behavioral: Negative for depression. The patient is not nervous/anxious.   Breast: Denies any new nodularity, masses, tenderness, nipple changes, or nipple discharge.       PAST MEDICAL/SURGICAL HISTORY:  Past Medical History:  Diagnosis Date  . Atypical chest pain   . Breast cancer ([place]) 2010  . Eczema   . Endometrial cancer ([place])   . Palpitations   . Personal history of chemotherapy   . Tobacco abuse    Past Surgical History:  Procedure Laterality Date  . ABDOMINAL HYSTERECTOMY  2012   TAH/BSO  .  AUGMENTATION MAMMAPLASTY Bilateral 2011  . CESAREAN SECTION  1994  . KNEE SURGERY Left 1999  . MASTECTOMY Left 2010     ALLERGIES:  Allergies  Allergen Reactions  . Indomethacin      CURRENT MEDICATIONS:  Outpatient Encounter Medications as of 10/30/2018  Medication Sig  . azelastine (ASTELIN) 0.1 % nasal spray Place 2 sprays into both nostrils  2 (two) times daily as needed for rhinitis. Use in each nostril as directed  . B Complex-C (SUPER B COMPLEX PO) Take 1 tablet by mouth daily.  . Biotin 5000 MCG CAPS Take 1 capsule by mouth daily.  . Calcium Carbonate-Vit D-Min (HM CALCIUM 600+D PLUS MINERALS PO) Take by mouth.  . clonazePAM (KLONOPIN) 0.5 MG tablet Take 0.5 mg by mouth 3 (three) times daily as needed.  Marland Kitchen co-enzyme Q-10 30 MG capsule Take 1 capsule (30 mg total) by mouth 3 (three) times daily.  . fluticasone (FLONASE) 50 MCG/ACT nasal spray Place 2 sprays into both nostrils daily.  Marland Kitchen glucosamine-chondroitin 500-400 MG tablet Take 1 tablet by mouth 3 (three) times daily.  Marland Kitchen guaiFENesin (MUCINEX) 600 MG 12 hr tablet Take by mouth 2 (two) times daily.  Marland Kitchen ibuprofen (ADVIL,MOTRIN) 200 MG tablet Take 200 mg by mouth every 6 (six) hours as needed.  . pantoprazole (PROTONIX) 40 MG tablet Take 40 mg by mouth daily.  . [DISCONTINUED] albuterol (PROVENTIL HFA;VENTOLIN HFA) 108 (90 Base) MCG/ACT inhaler   . [DISCONTINUED] doxycycline (VIBRAMYCIN) 100 MG capsule Take 100 mg by mouth 2 (two) times daily.   No facility-administered encounter medications on file as of 10/30/2018.      ONCOLOGIC FAMILY HISTORY:  Family History  Problem Relation Age of Onset  . Multiple myeloma Mother 9  . Colon polyps Father   . Thyroid nodules Father   . Melanoma Sister 41       has had 2-3 times  . Goiter Paternal Aunt   . Liver cancer Maternal Grandmother 80  . Thyroid cancer Sister   . Asperger's syndrome Other   . Breast cancer Cousin 49       triple negative  . Asthma Neg Hx   . Eczema Neg Hx   . Urticaria Neg Hx     GENETIC COUNSELING/TESTING: Not at this time  SOCIAL HISTORY:  Nolyn Eilert is single and luives with her duaghter in [place], [place].  Ms. 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  She denies any current or history of alcohol, or illicit drug use.  Current tobacco use:  Smoker, 3/4 ppd x 43 years=32.25 pack year tobacco history.  She did quit smoking in 06/2018.   PHYSICAL EXAMINATION:  Vital Signs: Vitals:   10/30/18 1313  BP: 123/81  Pulse: 70  Resp: 16  Temp: 98.6 F (37 C)  SpO2: 100%   Filed Weights   10/30/18 1313  Weight: 164 lb 12.8 oz (74.8 kg)   General: Well-nourished, well-appearing female in no acute distress.  Unaccompanied today.   HEENT: Head is normocephalic.  Pupils equal and reactive to light. Conjunctivae clear without exudate.  Sclerae anicteric. Oral mucosa is pink, moist.  Oropharynx is pink without lesions or erythema.  Lymph: No cervical, supraclavicular, or infraclavicular lymphadenopathy noted on palpation.  Cardiovascular: Regular rate and rhythm.Marland Kitchen Respiratory: Clear to auscultation bilaterally. Chest expansion symmetric; breathing non-labored.  Breast Exam:  -Left breast: No appreciable masses on palpation. No skin redness, thickening, or peau d'orange appearance; no nipple retraction or  nipple discharge; mild distortion in symmetry at previous lumpectomy site well healed scar without erythema or nodularity.  -Right breast: No appreciable masses on palpation. No skin redness, thickening, or peau d'orange appearance; no nipple retraction or nipple discharge;  -Axilla: No axillary adenopathy bilaterally.  GI: Abdomen soft and round; non-tender, non-distended. Nodule palpated in epigastric area, up towards spleen about 2cm, soft, unable to determine extent. Bowel sounds normoactive. No hepatosplenomegaly.   GU: Deferred.  Neuro: No focal deficits. Steady gait.  Psych: Mood and affect normal and appropriate for situation.  MSK: No focal spinal tenderness to palpation, full range of motion in bilateral upper extremities Extremities: No edema. Skin: Warm and dry.  LABORATORY DATA:  None for this visit   DIAGNOSTIC IMAGING:  Most recent mammogram:      ASSESSMENT AND PLAN:  Ms.. Shankman is a pleasant 63 y.o. female  with history of Stage IA left breast invasive ductal carcinoma, ER+/PR+/HER2-, diagnosed in 01/2009, treated with mastectomy, adjuvant chemotherapy, maintenance Trastuzumab x 1 year, and Tamoxifen x 2 years, followed by Anastrozole x 5 years completing in 2017.  She presents to the Survivorship Clinic for surveillance and routine follow-up.   1. History of breast cancer:  Ms. Abbasi is currently clinically and radiographically without evidence of disease or recurrence of breast cancer. She will be due for mammogram in 02/2018; orders placed today.  She will return to see me in one year for routine LTS follow up.  I encouraged her to call me with any questions or concerns before her next visit at the cancer center, and I would be happy to see her sooner, if needed.    2. Abdominal lipoma, skin lipomas: I ordered CT abdomen/pelvis to fully evaluate.  She had this done in 10/2017.  It was negative.  I called and reviewed the lipoma issue yesterday with Dr. Candise Che the radiologist who read it who notes no subcutaneous or soft tissue lesion was noted on the CT scan.  I did recommend she see dermatology  3. H/o lung nodules/greater than 30 pack year tobacco history: Pleshette has a greater than 30 pack year history, quit smoking within past 15 years, and is 63 years old.  She meets lung cancer screening criteria.  I reviewed with Dr. Candise Che on 10/30/2018 her pulmonary nodules and tobacco history.  She will start lung cancer screening this year with low dose CTs and based on our discussion this is reasonable.  She will do these annually.     4. Bone health:  Given Ms. Searight's age, history of breast cancer, tobacco history, and her previous anti-estrogen therapy with Anastrozole, she is at risk for bone demineralization. I will defer to her PCP regarding future bone density testing and management.  She was given education on specific food and activities to promote bone health.  5. Cancer screening:  Due to Ms.  Tolley's history and her age, she should receive screening for skin cancers, colon cancer, lung cancer (see above), and gynecologic cancers. She was encouraged to follow-up with her PCP for appropriate cancer screenings.   6. Health maintenance and wellness promotion: Ms. Dalby was encouraged to consume 5-7 servings of fruits and vegetables per day. She was also encouraged to engage in moderate to vigorous exercise for 30 minutes per day most days of the week. She was instructed to limit her alcohol consumption and was encouraged to continue to abstain from tobacco use.    Dispo:  -Return to cancer center in one year for  LTS follow up -Mammogram 03/2019 -CT lung cancer screening  A total of (30) minutes of face-to-face time was spent with this patient with greater than 50% of that time in counseling and care-coordination.   Gardenia Phlegm, NP Survivorship Program [facility] [phone]   Note: PRIMARY CARE PROVIDER Brigitte Pulse, [place] [phone]

## 2018-10-30 NOTE — Patient Instructions (Signed)

## 2018-10-30 NOTE — Telephone Encounter (Signed)
Gave avs and calendar ° °

## 2018-10-31 ENCOUNTER — Encounter: Payer: Self-pay | Admitting: Adult Health

## 2018-10-31 ENCOUNTER — Telehealth: Payer: Self-pay

## 2018-10-31 NOTE — Telephone Encounter (Signed)
Spoke with patient in regards to her CT scans.  NP, spoke with Dr.Gallerani and CT shows a lipoma and suggests having a dermatologist to confirm.  Also MD notes she can start having CT lung screens this year since she is stable.  Patient asked when should she one and who does she need to talk to about scheduling.  Forwarding to NP for further info.

## 2018-10-31 NOTE — Telephone Encounter (Signed)
Holly Melton, I need to clarify.  The CT shows no worrisome area in her abdomen, it didn't confirm the lipoma.  It doesn't show anything, so that is the likely cause of the skin lesion considering her history of multiple lipomas.  Will place order for CT lung cancer screening, and she will need these annually for the next 15 years so long as she has remained quit from smoking.

## 2018-10-31 NOTE — Telephone Encounter (Signed)
-----   Message from Gardenia Phlegm, NP sent at 10/30/2018  4:12 PM EST ----- Please let patient know that I talked to Dr. Candise Che, the radiologist who read her CT scan.  He looked at the area where I felt the lipomas.  He notes no worrisome areas in her abdomen.  That area is a lipoma.  Would have dermatology confirm.  He also notes that she can go to lung cancer screening CTs this year since her scans have remained so stable.

## 2018-12-06 ENCOUNTER — Ambulatory Visit (HOSPITAL_COMMUNITY)
Admission: RE | Admit: 2018-12-06 | Discharge: 2018-12-06 | Disposition: A | Discharge status: Home / Self Care | Payer: Managed Care, Other (non HMO) | Source: Ambulatory Visit | Attending: Adult Health | Admitting: Adult Health

## 2018-12-06 DIAGNOSIS — Z87891 Personal history of nicotine dependence: Secondary | ICD-10-CM | POA: Diagnosis present

## 2018-12-06 DIAGNOSIS — C50412 Malignant neoplasm of upper-outer quadrant of left female breast: Secondary | ICD-10-CM | POA: Diagnosis present

## 2018-12-07 ENCOUNTER — Telehealth: Payer: Self-pay

## 2018-12-07 NOTE — Telephone Encounter (Signed)
Spoke with patient informing of CT results.  Recommendation to repeat in 12 months. Patient vocied understanding. Knows to call center with any issues or concerns.

## 2018-12-07 NOTE — Telephone Encounter (Signed)
-----   Message from Gardenia Phlegm, NP sent at 12/06/2018  4:34 PM EST ----- Please call patient with results ----- Message ----- From: Interface, Rad Results In Sent: 12/06/2018   3:06 PM EST To: Gardenia Phlegm, NP

## 2019-01-04 ENCOUNTER — Ambulatory Visit: Payer: Managed Care, Other (non HMO) | Admitting: Family Medicine

## 2019-05-29 ENCOUNTER — Other Ambulatory Visit: Payer: Self-pay | Admitting: Obstetrics and Gynecology

## 2019-05-29 DIAGNOSIS — Z1231 Encounter for screening mammogram for malignant neoplasm of breast: Secondary | ICD-10-CM

## 2019-07-12 ENCOUNTER — Other Ambulatory Visit: Payer: Self-pay

## 2019-07-12 ENCOUNTER — Ambulatory Visit
Admission: RE | Admit: 2019-07-12 | Discharge: 2019-07-12 | Disposition: A | Discharge status: Home / Self Care | Payer: BC Managed Care – PPO | Source: Ambulatory Visit | Attending: Obstetrics and Gynecology | Admitting: Obstetrics and Gynecology

## 2019-07-12 ENCOUNTER — Other Ambulatory Visit: Payer: Self-pay | Admitting: Obstetrics and Gynecology

## 2019-07-12 DIAGNOSIS — Z1231 Encounter for screening mammogram for malignant neoplasm of breast: Secondary | ICD-10-CM

## 2019-07-16 ENCOUNTER — Encounter: Payer: Self-pay | Admitting: Cardiovascular Disease

## 2019-07-16 ENCOUNTER — Other Ambulatory Visit: Payer: Self-pay

## 2019-07-16 ENCOUNTER — Ambulatory Visit (INDEPENDENT_AMBULATORY_CARE_PROVIDER_SITE_OTHER): Payer: Managed Care, Other (non HMO) | Admitting: Cardiovascular Disease

## 2019-07-16 VITALS — BP 107/68 | HR 76 | Ht 66.5 in | Wt 160.0 lb

## 2019-07-16 DIAGNOSIS — Z13220 Encounter for screening for lipoid disorders: Secondary | ICD-10-CM | POA: Diagnosis not present

## 2019-07-16 DIAGNOSIS — Z720 Tobacco use: Secondary | ICD-10-CM

## 2019-07-16 DIAGNOSIS — R002 Palpitations: Secondary | ICD-10-CM

## 2019-07-16 DIAGNOSIS — R0789 Other chest pain: Secondary | ICD-10-CM | POA: Diagnosis not present

## 2019-07-16 DIAGNOSIS — Z136 Encounter for screening for cardiovascular disorders: Secondary | ICD-10-CM

## 2019-07-16 NOTE — Progress Notes (Signed)
07/16/2019 Holly Melton   [birth year]  [id number]  Primary Physician Brigitte Pulse, DO Primary Cardiologist: Lorretta Harp MD Gar[facility], [place], [place]  HPI:  Holly Melton is a 63 y.o.  mildly overweight separated (for the last 55 years) white female mother of one child who relocated from [place] to [place] and works at [employer] as a Librarian, academic". I last saw her in the office  06/19/2018. Her cardiovascular risk factor profile is notable for 40-pack-years of tobacco abuse currently smoking one pack per day. Otherwise there is no history of hypertension, diabetes or hyperlipidemia. There is no family history for heart disease. She is complaining of palpitations for years and occur fairly regularly but does admit to drinking 2-3 cups of coffee a day. She will episode of severe substernal chest pressure lasting 10-15 seconds approximately one week ago which subsided spontaneously. I obtained a 2-D echo which was normal, Myoview stress test which was low risk and nonischemic and inferior event monitor that showed short runs of PSVT. She has reduced her caffeine intake which has resulted in improvement in the frequency of her palpitations.  Unfortunately, her 17 year old daughter has become chronically ill with Crohn's disease requiring a colostomy..  She is under a lot of stress because of this and was having atypical chest pain when I saw her last.  Since I saw her a year ago she is remained stable.  She has had no acceleration in any symptoms.  She gets occasional atypical chest pain.  She did notably stop smoking in September of last year because of inability to breathe and was diagnosed with GERD as well.  Current Meds  Medication Sig  . B Complex-C (SUPER B COMPLEX PO) Take 1 tablet by mouth daily.  . Biotin 5000 MCG CAPS Take 1 capsule by mouth daily.  . Calcium Carbonate-Vit D-Min (HM CALCIUM 600+D PLUS MINERALS PO) Take by mouth.  . co-enzyme Q-10 30 MG capsule Take  1 capsule (30 mg total) by mouth 3 (three) times daily.  . fluticasone (FLONASE) 50 MCG/ACT nasal spray Place 2 sprays into both nostrils daily.  Marland Kitchen glucosamine-chondroitin 500-400 MG tablet Take 1 tablet by mouth 3 (three) times daily.  . pantoprazole (PROTONIX) 40 MG tablet Take 40 mg by mouth daily.     Allergies  Allergen Reactions  . Indomethacin     Social History   Socioeconomic History  . Marital status: Married    Spouse name: Not on file  . Number of children: 1  . Years of education: Not on file  . Highest education level: Not on file  Occupational History  . Not on file  Social Needs  . Financial resource strain: Not on file  . Food insecurity    Worry: Not on file    Inability: Not on file  . Transportation needs    Medical: Not on file    Non-medical: Not on file  Tobacco Use  . Smoking status: Former Smoker    Packs/day: 0.75    Years: 45.00    Pack years: 33.75    Types: E-cigarettes, Cigarettes    Quit date: 06/2018    Years since quitting: 1.0  . Smokeless tobacco: Never Used  . Tobacco comment: quit 2 weeks ago  Substance and Sexual Activity  . Alcohol use: Yes  . Drug use: Never  . Sexual activity: Not on file  Lifestyle  . Physical activity    Days per week: Not on file  Minutes per session: Not on file  . Stress: Not on file  Relationships  . Social Herbalist on phone: Not on file    Gets together: Not on file    Attends religious service: Not on file    Active member of club or organization: Not on file    Attends meetings of clubs or organizations: Not on file    Relationship status: Not on file  . Intimate partner violence    Fear of current or ex partner: Not on file    Emotionally abused: Not on file    Physically abused: Not on file    Forced sexual activity: Not on file  Other Topics Concern  . Not on file  Social History Narrative  . Not on file     Review of Systems: General: negative for chills, fever,  night sweats or weight changes.  Cardiovascular: negative for chest pain, dyspnea on exertion, edema, orthopnea, palpitations, paroxysmal nocturnal dyspnea or shortness of breath Dermatological: negative for rash Respiratory: negative for cough or wheezing Urologic: negative for hematuria Abdominal: negative for nausea, vomiting, diarrhea, bright red blood per rectum, melena, or hematemesis Neurologic: negative for visual changes, syncope, or dizziness All other systems reviewed and are otherwise negative except as noted above.    Blood pressure 107/68, pulse 76, height 5' 6.5" (1.689 m), weight 160 lb (72.6 kg), SpO2 97 %.  General appearance: alert and no distress Neck: no adenopathy, no carotid bruit, no JVD, supple, symmetrical, trachea midline and thyroid not enlarged, symmetric, no tenderness/mass/nodules Lungs: clear to auscultation bilaterally Heart: regular rate and rhythm, S1, S2 normal, no murmur, click, rub or gallop Extremities: extremities normal, atraumatic, no cyanosis or edema Pulses: 2+ and symmetric Skin: Skin color, texture, turgor normal. No rashes or lesions Neurologic: Alert and oriented X 3, normal strength and tone. Normal symmetric reflexes. Normal coordination and gait  EKG sinus rhythm at 76 without ST or T wave changes.  I personally reviewed this EKG.  ASSESSMENT AND PLAN:   Palpitations History of palpitations in the past with event monitor that showed short runs of PSVT.  She was drinking 2 to 3 cups of coffee a day and has since decreased that with decreasing episodes of palpitations.  Atypical chest pain History of atypical chest pain in the past with negative Myoview stress test  Tobacco abuse History of tobacco abuse discontinued 9/19      Lorretta Harp MD [facility], [facility] 07/16/2019 2:47 PM

## 2019-07-16 NOTE — Assessment & Plan Note (Signed)
History of tobacco abuse discontinued 9/19

## 2019-07-16 NOTE — Assessment & Plan Note (Signed)
History of atypical chest pain in the past with negative Myoview stress test

## 2019-07-16 NOTE — Patient Instructions (Addendum)
Medication Instructions:  Your physician recommends that you continue on your current medications as directed. Please refer to the Current Medication list given to you today.  If you need a refill on your cardiac medications before your next appointment, please call your pharmacy.   Lab work: Your physician recommends that you return for lab work WITHIN 1 WEEK: [place]  If you have labs (blood work) drawn today and your tests are completely normal, you will receive your results only by: Marland Kitchen MyChart Message (if you have MyChart) OR . A paper copy in the mail If you have any lab test that is abnormal or we need to change your treatment, we will call you to review the results.  Testing/Procedures: Your physician has requested that you have an abdominal aorta duplex. During this test, an ultrasound is used to evaluate the aorta. Allow 30 minutes for this exam. Do not eat after midnight the day before and avoid carbonated beverages  Follow-Up: At [facility], you and your health needs are our priority.  As part of our continuing mission to provide you with exceptional heart care, we have created designated Provider Care Teams.  These Care Teams include your primary Cardiologist (physician) and Advanced Practice Providers (APPs -  Physician Assistants and Nurse Practitioners) who all work together to provide you with the care you need, when you need it. You will need a follow up appointment in 12 months with Dr. Quay Burow.  Please call our office 2 months in advance to schedule this/each appointment.

## 2019-07-16 NOTE — Assessment & Plan Note (Signed)
History of palpitations in the past with event monitor that showed short runs of PSVT.  She was drinking 2 to 3 cups of coffee a day and has since decreased that with decreasing episodes of palpitations.

## 2019-07-31 ENCOUNTER — Ambulatory Visit (HOSPITAL_COMMUNITY)
Admission: RE | Admit: 2019-07-31 | Discharge: 2019-07-31 | Disposition: A | Discharge status: Home / Self Care | Payer: Managed Care, Other (non HMO) | Source: Ambulatory Visit | Attending: Internal Medicine | Admitting: Internal Medicine

## 2019-07-31 ENCOUNTER — Other Ambulatory Visit: Payer: Self-pay

## 2019-07-31 DIAGNOSIS — Z136 Encounter for screening for cardiovascular disorders: Secondary | ICD-10-CM | POA: Insufficient documentation

## 2019-07-31 LAB — HEPATIC FUNCTION PANEL
ALT: 15 IU/L (ref 0–32)
AST: 17 IU/L (ref 0–40)
Albumin: 4.6 g/dL (ref 3.8–4.8)
Alkaline Phosphatase: 85 IU/L (ref 39–117)
Bilirubin Total: 0.4 mg/dL (ref 0.0–1.2)
Bilirubin, Direct: 0.11 mg/dL (ref 0.00–0.40)
Total Protein: 7 g/dL (ref 6.0–8.5)

## 2019-07-31 LAB — LIPID PANEL
Chol/HDL Ratio: 3.3 ratio (ref 0.0–4.4)
Cholesterol, Total: 161 mg/dL (ref 100–199)
HDL: 49 mg/dL (ref >39)
LDL Chol Calc (NIH): 89 mg/dL (ref 0–99)
Triglycerides: 133 mg/dL (ref 0–149)
VLDL Cholesterol Cal: 23 mg/dL (ref 5–40)

## 2019-08-01 ENCOUNTER — Encounter: Payer: Self-pay | Admitting: *Deleted

## 2019-08-02 ENCOUNTER — Encounter: Payer: Self-pay | Admitting: *Deleted

## 2019-10-28 ENCOUNTER — Telehealth: Payer: Self-pay | Admitting: Adult Health

## 2019-10-28 NOTE — Telephone Encounter (Signed)
I talk with patient regarding reschedule °

## 2019-11-18 ENCOUNTER — Encounter: Payer: Self-pay | Admitting: Adult Health

## 2019-11-20 ENCOUNTER — Encounter: Payer: Self-pay | Admitting: Adult Health

## 2019-11-20 ENCOUNTER — Inpatient Hospital Stay: Payer: Managed Care, Other (non HMO) | Attending: Adult Health | Admitting: Adult Health

## 2019-11-20 ENCOUNTER — Other Ambulatory Visit: Payer: Self-pay

## 2019-11-20 VITALS — BP 125/89 | HR 72 | Temp 97.8°F | Resp 20 | Ht 66.5 in | Wt 164.6 lb

## 2019-11-20 DIAGNOSIS — M8580 Other specified disorders of bone density and structure, unspecified site: Secondary | ICD-10-CM | POA: Diagnosis not present

## 2019-11-20 DIAGNOSIS — Z720 Tobacco use: Secondary | ICD-10-CM | POA: Diagnosis not present

## 2019-11-20 DIAGNOSIS — Z9012 Acquired absence of left breast and nipple: Secondary | ICD-10-CM | POA: Diagnosis not present

## 2019-11-20 DIAGNOSIS — Z170 Estrogen receptor positive status [ER+]: Secondary | ICD-10-CM

## 2019-11-20 DIAGNOSIS — R918 Other nonspecific abnormal finding of lung field: Secondary | ICD-10-CM | POA: Insufficient documentation

## 2019-11-20 DIAGNOSIS — Z87891 Personal history of nicotine dependence: Secondary | ICD-10-CM | POA: Insufficient documentation

## 2019-11-20 DIAGNOSIS — Z853 Personal history of malignant neoplasm of breast: Secondary | ICD-10-CM | POA: Insufficient documentation

## 2019-11-20 DIAGNOSIS — D179 Benign lipomatous neoplasm, unspecified: Secondary | ICD-10-CM | POA: Insufficient documentation

## 2019-11-20 DIAGNOSIS — C50412 Malignant neoplasm of upper-outer quadrant of left female breast: Secondary | ICD-10-CM

## 2019-11-20 NOTE — Progress Notes (Signed)
CLINIC:  Survivorship   REASON FOR VISIT:  Routine follow-up for history of breast cancer.   BRIEF ONCOLOGIC HISTORY:   [place] woman relocated to [facility]  (1) status post left mastectomy and sentinel lymph node sampling April 2010 for an mpT1c pN0, stage IA invasive breast cancer, grade 2, estrogen receptor 96% positive, HER-2 amplified  (2) adjuvant chemotherapy consisted of dose dense cyclophosphamide and doxorubicin 4 followed by dose dense paclitaxel 4  (3) trastuzumab was initiated with the paclitaxel treatments and continued to complete a year (to August 2011)  (4) tamoxifen started October 2010 and continued through September 2012  (5) status post robotic TAH/BSO September 2012  (6) on anastrozole October 2012-2017             (a) mild osteopenia by bone density July 2013  (7) genetics testing  10/01/2014 through the BreastNext gene panel offered by [employer] found no deleterious mutations in ATM, BARD1, BRCA1, BRCA2, BRIP1, CDH1, CHEK2, MRE11A, MUTYH, NBN, NF1, PALB2, PTEN, RAD50, RAD51C, RAD51D, and TP53. Genetic testing found two variants of uncertain significance,, MUTYH p.R474C and NBN c.2071A>C,  ADDITIONAL CONCERNS:  (a) small pulmonary nodules noted on prior CT scans             (i) Chest CT scan 12/22/2015 shows stability over several years  (b) nonspecific thyroid nodules noted on prior ultrasounds  (c) occipital bone lesion noted by prior brain MRI             (a) noncontrasted CT 01/26/2016 = no change as compared to November 2015--this is most consistent with fibrous dysplasia  (d) multiple lipomas syndrome  INTERVAL HISTORY:  Ms. Wolaver presents to the Survivorship Clinic today for routine follow-up for her history of breast cancer.    She has a h/o heavy tobacco use of greater than 30 pack years.  She undergoes annual low dose CT scans for lung cancer screening.  Her most recent scan was on 12/06/2018 and was negative.     She also has annual mammograms and her most recent one was on 07/12/2019 and showed no evidence of malignancy and breast density category B.  Verdella has continued to see her PCP.  Her colonoscopy was delayed last year due to covid.  She is planning on getting this rescheduled.  She also notes that she is concerned about the lipomas.  She has not yet seen dermatology about this.  She last had a  CT abdomen/pelvis, that did not note any lipomas, even in soft tissue, and I spoke to the radiologist last year about it in detail.     REVIEW OF SYSTEMS:  Review of Systems  Constitutional: Negative for appetite change, chills, fatigue, fever and unexpected weight change.  HENT:   Negative for hearing loss and lump/mass.   Eyes: Negative for eye problems and icterus.  Respiratory: Negative for chest tightness, cough and shortness of breath.   Cardiovascular: Negative for chest pain, leg swelling and palpitations.  Gastrointestinal: Negative for abdominal distention, abdominal pain, constipation, diarrhea, nausea and vomiting.  Endocrine: Negative for hot flashes.  Genitourinary: Negative for difficulty urinating.   Musculoskeletal: Negative for arthralgias.  Skin: Negative for itching and rash.  Neurological: Negative for dizziness, extremity weakness, headaches and numbness.  Hematological: Negative for adenopathy. Does not bruise/bleed easily.  Psychiatric/Behavioral: Negative for depression. The patient is not nervous/anxious.   Breast: Denies any new nodularity, masses, tenderness, nipple changes, or nipple discharge.       PAST MEDICAL/SURGICAL HISTORY:  Past Medical History:  Diagnosis Date  . Atypical chest pain   . Atypical nevus 09/15/2016   Left Post Shoulder-Moderate, Left Flank-Mild, and Left Ant. Thigh-Mild  . Atypical nevus 02/06/2017   Right Mid Back-Mild  . Breast cancer ([place]) 2010  . Eczema   . Endometrial cancer ([place])   . Palpitations   . Personal history of  chemotherapy   . Tobacco abuse    Past Surgical History:  Procedure Laterality Date  . ABDOMINAL HYSTERECTOMY  2012   TAH/BSO  . AUGMENTATION MAMMAPLASTY Bilateral 2011  . CESAREAN SECTION  1994  . KNEE SURGERY Left 1999  . MASTECTOMY Left 2010     ALLERGIES:  Allergies  Allergen Reactions  . Indomethacin      CURRENT MEDICATIONS:  Outpatient Encounter Medications as of 11/20/2019  Medication Sig  . B Complex-C (SUPER B COMPLEX PO) Take 1 tablet by mouth daily.  . Biotin 5000 MCG CAPS Take 1 capsule by mouth daily.  . Calcium Carbonate-Vit D-Min (HM CALCIUM 600+D PLUS MINERALS PO) Take by mouth.  . Cholecalciferol (D3 ADULT PO) Take 5,000 mg by mouth.  . co-enzyme Q-10 30 MG capsule Take 1 capsule (30 mg total) by mouth 3 (three) times daily.  . famotidine (PEPCID) 20 MG tablet Take 20 mg by mouth 2 (two) times daily.  . fluticasone (FLONASE) 50 MCG/ACT nasal spray Place 2 sprays into both nostrils daily.  Marland Kitchen MAGNESIUM GLYCINATE PO Take 100 mg by mouth.  . Menaquinone-7 (VITAMIN K2 PO) Take 90 mcg by mouth.   . Omega-3 Fatty Acids (OMEGA-3 FISH OIL PO) Take by mouth.  . pantoprazole (PROTONIX) 20 MG tablet Take 20 mg by mouth daily.  . pantoprazole (PROTONIX) 40 MG tablet Take 40 mg by mouth daily.  . vitamin B-12 (CYANOCOBALAMIN) 500 MCG tablet Take 500 mcg by mouth daily.  Marland Kitchen glucosamine-chondroitin 500-400 MG tablet Take 1 tablet by mouth 3 (three) times daily.   No facility-administered encounter medications on file as of 11/20/2019.     ONCOLOGIC FAMILY HISTORY:  Family History  Problem Relation Age of Onset  . Multiple myeloma Mother 76  . Colon polyps Father   . Thyroid nodules Father   . Melanoma Sister 33       has had 2-3 times  . Goiter Paternal Aunt   . Liver cancer Maternal Grandmother 24  . Thyroid cancer Sister   . Asperger's syndrome Other   . Breast cancer Cousin 49       triple negative  . Asthma Neg Hx   . Eczema Neg Hx   . Urticaria Neg Hx      GENETIC COUNSELING/TESTING: Not at this time  SOCIAL HISTORY:  Tesla Keeler is single and luives with her duaghter in [place], [place].  Ms. 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  She denies any current or history of alcohol, or illicit drug use.  Current tobacco use: Smoker, 3/4 ppd x 43 years=32.25 pack year tobacco history.  She did quit smoking in 06/2018.    PHYSICAL EXAMINATION:  Vital Signs: Vitals:   11/20/19 1147  BP: 125/89  Pulse: 72  Resp: 20  Temp: 97.8 F (36.6 C)  SpO2: 99%   Filed Weights   11/20/19 1147  Weight: 164 lb 9.6 oz (74.7 kg)   General: Well-nourished, well-appearing female in no acute distress.  Unaccompanied today.   HEENT: Head is normocephalic.  Pupils equal and reactive to light. Conjunctivae clear  without exudate.  Sclerae anicteric. Oral mucosa is pink, moist.  Oropharynx is pink without lesions or erythema.  Lymph: No cervical, supraclavicular, or infraclavicular lymphadenopathy noted on palpation.  Cardiovascular: Regular rate and rhythm.Marland Kitchen Respiratory: Clear to auscultation bilaterally. Chest expansion symmetric; breathing non-labored.  Breast Exam:  -Left breast: No appreciable masses on palpation. No skin redness, thickening, or peau d'orange appearance; no nipple retraction or nipple discharge; mild distortion in symmetry at previous lumpectomy site well healed scar without erythema or nodularity.  -Right breast: No appreciable masses on palpation. No skin redness, thickening, or peau d'orange appearance; no nipple retraction or nipple discharge;  -Axilla: No axillary adenopathy bilaterally.  GI: Abdomen soft and round; non-tender, non-distended. Nodule palpated in epigastric area, up towards spleen about 2cm, soft, unable to determine extent. Bowel sounds normoactive. No hepatosplenomegaly.   GU: Deferred.  Neuro: No focal deficits. Steady gait.  Psych: Mood and affect normal and  appropriate for situation.  MSK: No focal spinal tenderness to palpation, full range of motion in bilateral upper extremities Extremities: No edema. Skin: Warm and dry.  LABORATORY DATA:  None for this visit   DIAGNOSTIC IMAGING:  Most recent mammogram:  CLINICAL DATA:  Screening. Prior malignant LEFT mastectomy in 2010.  EXAM: DIGITAL SCREENING UNILATERAL RIGHT MAMMOGRAM WITH IMPLANTS, CAD AND TOMO  COMPARISON:  Previous exam(s).  ACR Breast Density Category b: There are scattered areas of fibroglandular density.  FINDINGS: The patient has a retropectoral saline implants. Standard 2D full field CC and MLO views of the RIGHT breast and tomosynthesis and synthesized implant displaced CC and MLO views of the RIGHT breast were obtained.  There are no findings suspicious for malignancy. Images were processed with CAD.  IMPRESSION: No mammographic evidence of malignancy. A result letter of this screening mammogram will be mailed directly to the patient.  RECOMMENDATION: Screening mammogram in one year. (Code:SM-B-01Y)  BI-RADS CATEGORY  1:  Negative.   Electronically Signed   By: Evangeline Dakin M.D.  On: 07/15/2019 08:02 CLINICAL DATA:  65 year old female with 38 pack-year history of smoking. Lung cancer screening.  EXAM: CT CHEST WITHOUT CONTRAST LOW-DOSE FOR LUNG CANCER SCREENING  TECHNIQUE: Multidetector CT imaging of the chest was performed following the standard protocol without IV contrast.  COMPARISON:  Standard CT chest 11/08/2017  FINDINGS: Cardiovascular: The heart size is normal. No substantial pericardial effusion. Atherosclerotic calcification is noted in the wall of the thoracic aorta.  Mediastinum/Nodes: No mediastinal lymphadenopathy. No evidence for gross hilar lymphadenopathy although assessment is limited by the lack of intravenous contrast on today's study. The esophagus has normal imaging features. There is no axillary  lymphadenopathy.  Lungs/Pleura: The central tracheobronchial airways are patent. Biapical pleuroparenchymal scarring noted. Centrilobular emphsyema noted. Scattered bilateral pulmonary nodules measure up to maximum volume derived equivalent diameter of 4.6 mm. There is a cystic area in the central left upper lobe which is similar to prior without appreciable soft tissue component.  Upper Abdomen: Unremarkable.  Musculoskeletal: No worrisome lytic or sclerotic osseous abnormality.  IMPRESSION: 1. Lung-RADS 2, benign appearance or behavior. Continue annual screening with low-dose chest CT without contrast in 12 months. 2.  Aortic Atherosclerois (ICD10-170.0) 3.  Emphysema. (GQB16-X45.9)   Electronically Signed   By: Misty Stanley M.D.   On: 12/06/2018 15:04   ASSESSMENT AND PLAN:  Ms.. Stlaurent is a pleasant 64 y.o. female with history of Stage IA left breast invasive ductal carcinoma, ER+/PR+/HER2-, diagnosed in 01/2009, treated with mastectomy, adjuvant chemotherapy, maintenance Trastuzumab x 1 year,  and Tamoxifen x 2 years, followed by Anastrozole x 5 years completing in 2017.  She presents to the Survivorship Clinic for surveillance and routine follow-up.   1. History of breast cancer:  Ms. Semidey is currently clinically and radiographically without evidence of disease or recurrence of breast cancer. She will 07/2020; orders placed today.  She will return to see me in one year for routine LTS follow up.  I encouraged her to call me with any questions or concerns before her next visit at the cancer center, and I would be happy to see her sooner, if needed.    2. H/o lung nodules/greater than 30 pack year tobacco history: She is undergoing annual low dose lung cancer screening, and will continue this.    3. Multiple lipomas: I continue to recommend that she f/u with dermatology.  She told me she is going to make an appt.    4. Bone health:  Given Ms. Sallie's age, history of  breast cancer, tobacco history, and her previous anti-estrogen therapy with Anastrozole, she is at risk for bone demineralization. I will defer to her PCP regarding future bone density testing and management.  She was given education on specific food and activities to promote bone health.  5. Cancer screening:  Due to Ms. Cunningham's history and her age, she should receive screening for skin cancers, colon cancer, lung cancer (see above), and gynecologic cancers. She was encouraged to follow-up with her PCP for appropriate cancer screenings.   6. Health maintenance and wellness promotion: Ms. Pinnock was encouraged to consume 5-7 servings of fruits and vegetables per day. She was also encouraged to engage in moderate to vigorous exercise for 30 minutes per day most days of the week. She was instructed to limit her alcohol consumption and was encouraged to continue to abstain from tobacco use.    Dispo:  -Return to cancer center in one year for LTS follow up -Mammogram 07/2020 -CT lung cancer screening in the next couple of weeks  Total encounter time: 30 minutes   Gardenia Phlegm, NP [place] [phone]   Note: PRIMARY CARE PROVIDER Brigitte Pulse, [place] [phone]  *Total Encounter Time as defined by the Centers for Medicare and Medicaid Services includes, in addition to the face-to-face time of a patient visit (documented in the note above) non-face-to-face time: obtaining and reviewing outside history, ordering and reviewing medications, tests or procedures, care coordination (communications with other health care professionals or caregivers) and documentation in the medical record.

## 2019-11-21 ENCOUNTER — Telehealth: Payer: Self-pay | Admitting: Adult Health

## 2019-11-21 NOTE — Telephone Encounter (Signed)
I talk with patient regarding schedule  

## 2019-12-10 ENCOUNTER — Ambulatory Visit (HOSPITAL_COMMUNITY)
Admission: RE | Admit: 2019-12-10 | Discharge: 2019-12-10 | Disposition: A | Discharge status: Home / Self Care | Payer: Managed Care, Other (non HMO) | Source: Ambulatory Visit | Attending: Adult Health | Admitting: Adult Health

## 2019-12-10 ENCOUNTER — Other Ambulatory Visit: Payer: Self-pay

## 2019-12-10 DIAGNOSIS — Z720 Tobacco use: Secondary | ICD-10-CM | POA: Insufficient documentation

## 2019-12-11 ENCOUNTER — Telehealth: Payer: Self-pay

## 2019-12-11 NOTE — Telephone Encounter (Signed)
Spoke with pt by phone and gave results of CT scan.  Instructed pt she will need another scan in 1 yr.

## 2020-01-15 ENCOUNTER — Telehealth: Payer: Self-pay | Admitting: Cardiovascular Disease

## 2020-01-15 NOTE — Telephone Encounter (Signed)
Left message to call office

## 2020-01-15 NOTE — Telephone Encounter (Signed)
I spoke with patient. She received covid vaccine on Monday. She had brief burning in chest after vaccine but this has gone away.  She is very tired. She has been falling asleep on the couch recently and not using CPAP as much as before. She reports fluttering off and on in chest since Monday. Only notices when sitting not when moving around. Able to do normal activities. Has had fluttering in the past but seems to have increased since Monday. No change in caffeine intake.  Will send to Dr Gwenlyn Found to see if he feels this is related to vaccine and if further testing indicated.

## 2020-01-15 NOTE — Telephone Encounter (Signed)
New message     Patients states that she got her johnson-n-johnson vaccine on Monday.  She states that she has had fluttering in her chest off and on since Monday.  She states that she is also fatigue.  Is this all from the vaccine?  Please advise

## 2020-01-15 NOTE — Telephone Encounter (Signed)
She's had palp for years. No way to access  whether the 'vaccine' caused increase. She' welcome to be seen by an APP if she feels she needs to be

## 2020-01-16 NOTE — Telephone Encounter (Signed)
Lpm with Dr Kennon Holter recommendation. 4/8

## 2020-07-09 ENCOUNTER — Other Ambulatory Visit: Payer: Self-pay | Admitting: Obstetrics and Gynecology

## 2020-07-09 DIAGNOSIS — Z1231 Encounter for screening mammogram for malignant neoplasm of breast: Secondary | ICD-10-CM

## 2020-07-17 ENCOUNTER — Other Ambulatory Visit: Payer: Self-pay

## 2020-07-17 ENCOUNTER — Encounter: Payer: Self-pay | Admitting: Cardiovascular Disease

## 2020-07-17 ENCOUNTER — Ambulatory Visit (INDEPENDENT_AMBULATORY_CARE_PROVIDER_SITE_OTHER): Payer: Managed Care, Other (non HMO) | Admitting: Cardiovascular Disease

## 2020-07-17 DIAGNOSIS — I2510 Atherosclerotic heart disease of native coronary artery without angina pectoris: Secondary | ICD-10-CM

## 2020-07-17 DIAGNOSIS — R0789 Other chest pain: Secondary | ICD-10-CM | POA: Diagnosis not present

## 2020-07-17 DIAGNOSIS — R002 Palpitations: Secondary | ICD-10-CM

## 2020-07-17 DIAGNOSIS — Z720 Tobacco use: Secondary | ICD-10-CM

## 2020-07-17 DIAGNOSIS — E785 Hyperlipidemia, unspecified: Secondary | ICD-10-CM | POA: Insufficient documentation

## 2020-07-17 NOTE — Assessment & Plan Note (Signed)
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  She did decrease her caffeine intake at that time.  She did have her J&J Covid shot on April 5 of this year and developed recurrent palpitations after this which are only now decreasing in severity.

## 2020-07-17 NOTE — Assessment & Plan Note (Signed)
History of coronary calcification seen on screening chest CT performed 12/11/2019 with calcium in the LAD territory.  She does have positive risk factors.  Ongoing to get a coronary calcium score to further quantitate

## 2020-07-17 NOTE — Patient Instructions (Signed)
  Testing/Procedures:  CT CARDIAC CALCIUM SCORING AT [facility]   Follow-Up: At [facility], you and your health needs are our priority.  As part of our continuing mission to provide you with exceptional heart care, we have created designated Provider Care Teams.  These Care Teams include your primary Cardiologist (physician) and Advanced Practice Providers (APPs -  Physician Assistants and Nurse Practitioners) who all work together to provide you with the care you need, when you need it.  We recommend signing up for the patient portal called "MyChart".  Sign up information is provided on this After Visit Summary.  MyChart is used to connect with patients for Virtual Visits (Telemedicine).  Patients are able to view lab/test results, encounter notes, upcoming appointments, etc.  Non-urgent messages can be sent to your provider as well.   To learn more about what you can do with MyChart, go to [email or web address].    Your next appointment:   12 month(s)  The format for your next appointment:   In Person  Provider:   You may see Quay Burow MD or one of the following Advanced Practice Providers on your designated Care Team:    Kerin Ransom, PA-C  [place], [place]  Coletta Memos, [place]

## 2020-07-17 NOTE — Assessment & Plan Note (Signed)
History of mild hyperlipidemia not on statin therapy with recent lipid profile performed by her PCP 07/08/2020 revealing a total cholesterol 178, LDL 109 and HDL 54.  We will further refine her lipid management based on her coronary calcium score.

## 2020-07-17 NOTE — Assessment & Plan Note (Signed)
History of discontinued tobacco tobacco abuse now vaping.

## 2020-07-17 NOTE — Assessment & Plan Note (Signed)
History of atypical chest pain in the past with a negative Myoview stress test 11/06/2015.

## 2020-07-17 NOTE — Progress Notes (Signed)
07/17/2020 Holly Melton   1955-12-25  [id number]  Primary Physician Holly Pulse, DO Primary Cardiologist: Holly Harp MD Holly Melton, [place], [place]  HPI:  Holly Melton is a 64 y.o.  mildly overweight separated (for the last 44 years) white female mother of one child who relocated from [place] to [place] and works at [employer] as a Librarian, academic". I last saw her in the office  07/16/2019. Her cardiovascular risk factor profile is notable for 40-pack-years of tobacco abuse currently smoking one pack per day. Otherwise there is no history of hypertension, diabetes or hyperlipidemia. There is no family history for heart disease. She is complaining of palpitations for years and occur fairly regularly but does admit to drinking 2-3 cups of coffee a day. She will episode of severe substernal chest pressure lasting 10-15 seconds approximately one week ago which subsided spontaneously. I obtained a 2-D echo which was normal, Myoview stress test which was low risk and nonischemic and inferior event monitor that showed short runs of PSVT. She has reduced her caffeine intake which has resulted in improvement in the frequency of her palpitations.  Unfortunately, her 71 year old daughter has become chronically ill with Crohn's disease requiring a colostomy.. She is under a lot of stress because of this and was having atypical chest pain when I saw her last.  Since I saw her a year ago she is remained stable.    She did stop smoking several years ago but continues to vape.  She did take the J&J Covid vaccine on 01/13/2020 and developed palpitations after that controlled in the office slowly but diminishing in frequency and severity.  She otherwise denies chest pain or shortness of breath.  She had a chest CT performed 12/11/2019 for cancer screening that revealed coronary calcification in the LAD territory.  Current Meds  Medication Sig  . B Complex-C (SUPER B COMPLEX PO) Take 1 tablet  by mouth daily.  . Biotin 5000 MCG CAPS Take 1 capsule by mouth daily.  . Calcium Carbonate-Vit D-Min (HM CALCIUM 600+D PLUS MINERALS PO) Take by mouth.  . Cholecalciferol (D3 ADULT PO) Take 5,000 mg by mouth.  . fluticasone (FLONASE) 50 MCG/ACT nasal spray Place 2 sprays into both nostrils daily.  Marland Kitchen glucosamine-chondroitin 500-400 MG tablet Take 1 tablet by mouth 3 (three) times daily.  Marland Kitchen MAGNESIUM GLYCINATE PO Take 100 mg by mouth.  . Menaquinone-7 (VITAMIN K2 PO) Take 90 mcg by mouth.   . Omega-3 Fatty Acids (OMEGA-3 FISH OIL PO) Take by mouth.     Allergies  Allergen Reactions  . Indomethacin     Social History   Socioeconomic History  . Marital status: Married    Spouse name: Not on file  . Number of children: 1  . Years of education: Not on file  . Highest education level: Not on file  Occupational History  . Not on file  Tobacco Use  . Smoking status: Former Smoker    Packs/day: 0.75    Years: 45.00    Pack years: 33.75    Types: E-cigarettes, Cigarettes    Quit date: 06/2018    Years since quitting: 2.1  . Smokeless tobacco: Never Used  . Tobacco comment: quit 2 weeks ago  Substance and Sexual Activity  . Alcohol use: Yes  . Drug use: Never  . Sexual activity: Not on file  Other Topics Concern  . Not on file  Social History Narrative  . Not on file   Social Determinants  of Health   Financial Resource Strain:   . Difficulty of Paying Living Expenses: Not on file  Food Insecurity:   . Worried About Charity fundraiser in the Last Year: Not on file  . Ran Out of Food in the Last Year: Not on file  Transportation Needs:   . Lack of Transportation (Medical): Not on file  . Lack of Transportation (Non-Medical): Not on file  Physical Activity:   . Days of Exercise per Week: Not on file  . Minutes of Exercise per Session: Not on file  Stress:   . Feeling of Stress : Not on file  Social Connections:   . Frequency of Communication with Friends and Family:  Not on file  . Frequency of Social Gatherings with Friends and Family: Not on file  . Attends Religious Services: Not on file  . Active Member of Clubs or Organizations: Not on file  . Attends Archivist Meetings: Not on file  . Marital Status: Not on file  Intimate Partner Violence:   . Fear of Current or Ex-Partner: Not on file  . Emotionally Abused: Not on file  . Physically Abused: Not on file  . Sexually Abused: Not on file     Review of Systems: General: negative for chills, fever, night sweats or weight changes.  Cardiovascular: negative for chest pain, dyspnea on exertion, edema, orthopnea, palpitations, paroxysmal nocturnal dyspnea or shortness of breath Dermatological: negative for rash Respiratory: negative for cough or wheezing Urologic: negative for hematuria Abdominal: negative for nausea, vomiting, diarrhea, bright red blood per rectum, melena, or hematemesis Neurologic: negative for visual changes, syncope, or dizziness All other systems reviewed and are otherwise negative except as noted above.    Blood pressure 104/69, Melton 69, height 5' 6.5" (1.689 m), weight 162 lb (73.5 kg), SpO2 99 %.  General appearance: alert and no distress Neck: no adenopathy, no carotid bruit, no JVD, supple, symmetrical, trachea midline and thyroid not enlarged, symmetric, no tenderness/mass/nodules Lungs: clear to auscultation bilaterally Heart: regular rate and rhythm, S1, S2 normal, no murmur, click, rub or gallop Extremities: extremities normal, atraumatic, no cyanosis or edema Pulses: 2+ and symmetric Skin: Skin color, texture, turgor normal. No rashes or lesions Neurologic: Alert and oriented X 3, normal strength and tone. Normal symmetric reflexes. Normal coordination and gait  EKG sinus rhythm at 69 without ST or T wave changes.  I personally reviewed this EKG  ASSESSMENT AND PLAN:   Palpitations History of palpitations in the past found to be PSVT on event  monitoring.  She did decrease her caffeine intake at that time.  She did have her J&J Covid shot on April 5 of this year and developed recurrent palpitations after this which are only now decreasing in severity.  Atypical chest pain History of atypical chest pain in the past with a negative Myoview stress test 11/06/2015.  Tobacco abuse History of discontinued tobacco tobacco abuse now vaping.  Coronary artery calcification seen on CT scan History of coronary calcification seen on screening chest CT performed 12/11/2019 with calcium in the LAD territory.  She does have positive risk factors.  Ongoing to get a coronary calcium score to further quantitate  Hyperlipidemia History of mild hyperlipidemia not on statin therapy with recent lipid profile performed by her PCP 07/08/2020 revealing a total cholesterol 178, LDL 109 and HDL 54.  We will further refine her lipid management based on her coronary calcium score.      Holly Harp MD  FACP,FACC,FAHA, [place] 07/17/2020 8:33 AM

## 2020-07-28 ENCOUNTER — Ambulatory Visit (INDEPENDENT_AMBULATORY_CARE_PROVIDER_SITE_OTHER)
Admission: RE | Admit: 2020-07-28 | Discharge: 2020-07-28 | Disposition: A | Discharge status: Home / Self Care | Payer: Self-pay | Source: Ambulatory Visit | Attending: Cardiovascular Disease | Admitting: Cardiovascular Disease

## 2020-07-28 ENCOUNTER — Other Ambulatory Visit: Payer: Self-pay

## 2020-07-28 DIAGNOSIS — I2510 Atherosclerotic heart disease of native coronary artery without angina pectoris: Secondary | ICD-10-CM

## 2020-07-29 ENCOUNTER — Ambulatory Visit
Admission: RE | Admit: 2020-07-29 | Discharge: 2020-07-29 | Disposition: A | Discharge status: Home / Self Care | Payer: Managed Care, Other (non HMO) | Source: Ambulatory Visit | Attending: Obstetrics and Gynecology | Admitting: Obstetrics and Gynecology

## 2020-07-29 DIAGNOSIS — Z1231 Encounter for screening mammogram for malignant neoplasm of breast: Secondary | ICD-10-CM

## 2020-07-31 ENCOUNTER — Telehealth: Payer: Self-pay

## 2020-07-31 NOTE — Telephone Encounter (Signed)
Called patient left message on personal voice mail Dr.Berry advised since your coronary calcium score is low at 2.4,no statin is needed.Advised to follow heart healthy diet.

## 2020-11-23 ENCOUNTER — Encounter: Payer: BC Managed Care – PPO | Admitting: Adult Health

## 2020-12-04 ENCOUNTER — Inpatient Hospital Stay: Payer: BC Managed Care – PPO | Attending: Adult Health | Admitting: Adult Health

## 2020-12-04 ENCOUNTER — Encounter: Payer: Self-pay | Admitting: Adult Health

## 2020-12-04 ENCOUNTER — Other Ambulatory Visit: Payer: Self-pay

## 2020-12-04 VITALS — BP 113/69 | HR 71 | Temp 99.2°F | Resp 18 | Ht 66.5 in | Wt 164.0 lb

## 2020-12-04 DIAGNOSIS — G939 Disorder of brain, unspecified: Secondary | ICD-10-CM | POA: Diagnosis not present

## 2020-12-04 DIAGNOSIS — E2839 Other primary ovarian failure: Secondary | ICD-10-CM | POA: Diagnosis not present

## 2020-12-04 DIAGNOSIS — Z87891 Personal history of nicotine dependence: Secondary | ICD-10-CM | POA: Diagnosis not present

## 2020-12-04 DIAGNOSIS — M8580 Other specified disorders of bone density and structure, unspecified site: Secondary | ICD-10-CM | POA: Insufficient documentation

## 2020-12-04 DIAGNOSIS — Z9012 Acquired absence of left breast and nipple: Secondary | ICD-10-CM | POA: Insufficient documentation

## 2020-12-04 DIAGNOSIS — Z8709 Personal history of other diseases of the respiratory system: Secondary | ICD-10-CM | POA: Diagnosis not present

## 2020-12-04 DIAGNOSIS — C50412 Malignant neoplasm of upper-outer quadrant of left female breast: Secondary | ICD-10-CM | POA: Diagnosis not present

## 2020-12-04 DIAGNOSIS — Z853 Personal history of malignant neoplasm of breast: Secondary | ICD-10-CM | POA: Diagnosis not present

## 2020-12-04 NOTE — Progress Notes (Signed)
CLINIC:  Survivorship   REASON FOR VISIT:  Routine follow-up for history of breast cancer.   BRIEF ONCOLOGIC HISTORY:   [place] woman relocated to [facility]  (1) status post left mastectomy and sentinel lymph node sampling April 2010 for an mpT1c pN0, stage IA invasive breast cancer, grade 2, estrogen receptor 96% positive, HER-2 amplified  (2) adjuvant chemotherapy consisted of dose dense cyclophosphamide and doxorubicin 4 followed by dose dense paclitaxel 4  (3) trastuzumab was initiated with the paclitaxel treatments and continued to complete a year (to August 2011)  (4) tamoxifen started October 2010 and continued through September 2012  (5) status post robotic TAH/BSO September 2012  (6) on anastrozole October 2012-2017             (a) mild osteopenia by bone density July 2013  (7) genetics testing  10/01/2014 through the BreastNext gene panel offered by [employer] found no deleterious mutations in ATM, BARD1, BRCA1, BRCA2, BRIP1, CDH1, CHEK2, MRE11A, MUTYH, NBN, NF1, PALB2, PTEN, RAD50, RAD51C, RAD51D, and TP53. 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    She has a h/o heavy tobacco use of greater than 30 pack years.  She undergoes annual low dose CT scans for lung cancer screening.  Her most recent scan was on 12/10/2019 and was negative.     She also has annual mammograms and her most recent one was on 07/31/2020 and showed no evidence of malignancy and breast density category B.  Holly Melton has continued to see her PCP.  She is up to date with her cancer screenings.    She notes that she was let go from her job and is currently unemployed.  She is exercising on her treadmill.  She denies any new issues.  She wonders when/if she should have another brain MRI to evaluate the skull lesion noted in 2015 MRI   REVIEW OF SYSTEMS:  Review of Systems  Constitutional: Negative for appetite change, chills, fatigue, fever and unexpected weight change.  HENT:   Negative for hearing loss and lump/mass.   Eyes: Negative for eye problems and icterus.  Respiratory: Negative for chest tightness, cough and shortness of breath.   Cardiovascular: Negative for chest pain, leg swelling and palpitations.  Gastrointestinal: Negative for abdominal distention, abdominal pain, constipation, diarrhea, nausea and vomiting.  Endocrine: Negative for hot flashes.  Genitourinary: Negative for difficulty urinating.   Musculoskeletal: Negative for arthralgias.  Skin: Negative for itching and rash.  Neurological: Negative for dizziness, extremity weakness, headaches and numbness.  Hematological: Negative for adenopathy. Does not bruise/bleed easily.  Psychiatric/Behavioral: Negative for depression. The patient is not nervous/anxious.   Breast: Denies any new nodularity, masses, tenderness, nipple changes, or nipple discharge.       PAST MEDICAL/SURGICAL HISTORY:  Past Medical History:  Diagnosis Date  . Atypical chest  pain   . Atypical nevus 09/15/2016   Left Post Shoulder-Moderate, Left Flank-Mild, and Left Ant. Thigh-Mild  . Atypical nevus 02/06/2017   Right Mid Back-Mild  . Breast cancer ([place]) 2010  . Eczema   . Endometrial cancer ([place])   . Palpitations   . Personal history of chemotherapy   . Tobacco abuse    Past Surgical History:   Procedure Laterality Date  . ABDOMINAL HYSTERECTOMY  2012   TAH/BSO  . AUGMENTATION MAMMAPLASTY Bilateral 2011  . CESAREAN SECTION  1994  . KNEE SURGERY Left 1999  . MASTECTOMY Left 2010     ALLERGIES:  Allergies  Allergen Reactions  . Ambrosia Trifida (Tall Ragweed) Allergy Skin Test Shortness Of Breath  . Indomethacin      CURRENT MEDICATIONS:  Outpatient Encounter Medications as of 12/04/2020  Medication Sig  . B Complex-C (SUPER B COMPLEX PO) Take 1 tablet by mouth daily.  . Biotin 5000 MCG CAPS Take 1 capsule by mouth daily.  . Calcium Carbonate-Vit D-Min (HM CALCIUM 600+D PLUS MINERALS PO) Take by mouth.  . Cholecalciferol (D3 ADULT PO) Take 5,000 mg by mouth.  . fluticasone (FLONASE) 50 MCG/ACT nasal spray Place 2 sprays into both nostrils daily.  Marland Kitchen glucosamine-chondroitin 500-400 MG tablet Take 1 tablet by mouth 3 (three) times daily.  Marland Kitchen MAGNESIUM GLYCINATE PO Take 100 mg by mouth.  . Menaquinone-7 (VITAMIN K2 PO) Take 90 mcg by mouth.   . Omega-3 Fatty Acids (OMEGA-3 FISH OIL PO) Take by mouth.   No facility-administered encounter medications on file as of 12/04/2020.     ONCOLOGIC FAMILY HISTORY:  Family History  Problem Relation Age of Onset  . Multiple myeloma Mother 55  . Colon polyps Father   . Thyroid nodules Father   . Melanoma Sister 75       has had 2-3 times  . Goiter Paternal Aunt   . Liver cancer Maternal Grandmother 58  . Thyroid cancer Sister   . Breast cancer Sister 14  . Asperger's syndrome Other   . Breast cancer Cousin 49       triple negative  . Asthma Neg Hx   . Eczema Neg Hx   . Urticaria Neg Hx     GENETIC COUNSELING/TESTING: Not at this time  SOCIAL HISTORY:  Holly Melton is single and luives with her duaghter in [place], [place].  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  She denies any current or history of alcohol, or illicit drug use.   Current tobacco use: Smoker, 3/4 ppd x 43 years=32.25 pack year tobacco history.  She did quit smoking in 06/2018.    PHYSICAL EXAMINATION:  Vital Signs: Vitals:   12/04/20 1155  BP: 113/69  Pulse: 71  Resp: 18  Temp: 99.2 F (37.3 C)  SpO2: 98%   Filed Weights   12/04/20 1155  Weight: 164 lb (74.4 kg)   General: Well-nourished, well-appearing female in no acute distress.  Unaccompanied today.   HEENT: Head is normocephalic.  Pupils equal and reactive to light. Conjunctivae clear without exudate.  Sclerae anicteric. Oral mucosa is pink, moist.  Oropharynx is pink without lesions or erythema.  Lymph: No cervical, supraclavicular, or infraclavicular lymphadenopathy noted on palpation.  Cardiovascular: Regular rate and rhythm.Marland Kitchen Respiratory: Clear to auscultation bilaterally. Chest expansion symmetric; breathing non-labored.  Breast Exam:  -Left breast: No appreciable masses on palpation. No skin redness, thickening, or peau d'orange appearance;  no nipple retraction or nipple discharge; mild distortion in symmetry at previous lumpectomy site well healed scar without erythema or nodularity.  -Right breast: No appreciable masses on palpation. No skin redness, thickening, or peau d'orange appearance; no nipple retraction or nipple discharge;  -Axilla: No axillary adenopathy bilaterally.  GI: Abdomen soft and round; non-tender, non-distended. Nodule palpated in epigastric area, up towards spleen about 2cm, soft, unable to determine extent. Bowel sounds normoactive. No hepatosplenomegaly.   GU: Deferred.  Neuro: No focal deficits. Steady gait.  Psych: Mood and affect normal and appropriate for situation.  MSK: No focal spinal tenderness to palpation, full range of motion in bilateral upper extremities Extremities: No edema. Skin: Warm and dry.  LABORATORY DATA:  None for this visit      ASSESSMENT AND PLAN:  Ms.. Waldman is a pleasant 65 y.o. female with history of Stage IA left  breast invasive ductal carcinoma, ER+/PR+/HER2-, diagnosed in 01/2009, treated with mastectomy, adjuvant chemotherapy, maintenance Trastuzumab x 1 year, and Tamoxifen x 2 years, followed by Anastrozole x 5 years completing in 2017.  She presents to the Survivorship Clinic for surveillance and routine follow-up.   1. History of breast cancer:  Ms. Manalang is currently clinically and radiographically without evidence of disease or recurrence of breast cancer. She was recommended to continue with annual mammograms.  She will return to see me in one year for routine LTS follow up.  I encouraged her to call me with any questions or concerns before her next visit at the cancer center, and I would be happy to see her sooner, if needed.    2. H/o lung nodules/greater than 30 pack year tobacco history: She is undergoing annual low dose lung cancer screening, and will continue this.  No nodules or sign of lung cancer.    3. Previous brain lesion: MRI brain ordered to f/u old lesion.  If no change, then we will not need to follow this.  4. Bone health:  Given Ms. Mcquaig's age, history of breast cancer, tobacco history, and her previous anti-estrogen therapy with Anastrozole, she is at risk for bone demineralization. I will defer to her PCP regarding future bone density testing and management.  She was given education on specific food and activities to promote bone health.  5. Cancer screening:  Due to Ms. Drakeford's history and her age, she should receive screening for skin cancers, colon cancer, lung cancer (see above), and gynecologic cancers. She was encouraged to follow-up with her PCP for appropriate cancer screenings.   6. Health maintenance and wellness promotion: Ms. Tamayo was encouraged to consume 5-7 servings of fruits and vegetables per day. She was also encouraged to engage in moderate to vigorous exercise for 30 minutes per day most days of the week. She was instructed to limit her alcohol consumption  and was encouraged to continue to abstain from tobacco use.    Dispo:  -Return to cancer center in one year for LTS follow up -Annual Mammogram -Annual CT lung cancer screening -MRI brain  Total encounter time: 20 minutes*   Gardenia Phlegm, NP [place] [phone]   Note: PRIMARY CARE PROVIDER Jolinda Croak, MD [phone]  *Total Encounter Time as defined by the Centers for Medicare and Medicaid Services includes, in addition to the face-to-face time of a patient visit (documented in the note above) non-face-to-face time: obtaining and reviewing outside history, ordering and reviewing medications, tests or procedures, care coordination (communications with other health  care professionals or caregivers) and documentation in the medical record.

## 2020-12-08 ENCOUNTER — Telehealth (HOSPITAL_COMMUNITY): Payer: Self-pay

## 2020-12-08 ENCOUNTER — Telehealth: Payer: Self-pay | Admitting: Adult Health

## 2020-12-08 NOTE — Telephone Encounter (Signed)
No 2/25 los. No changes made to pt's schedule.

## 2020-12-15 ENCOUNTER — Ambulatory Visit (HOSPITAL_COMMUNITY)
Admission: RE | Admit: 2020-12-15 | Discharge: 2020-12-15 | Disposition: A | Discharge status: Home / Self Care | Payer: BC Managed Care – PPO | Source: Ambulatory Visit | Attending: Adult Health | Admitting: Adult Health

## 2020-12-15 ENCOUNTER — Other Ambulatory Visit: Payer: Self-pay

## 2020-12-15 DIAGNOSIS — Z87891 Personal history of nicotine dependence: Secondary | ICD-10-CM | POA: Diagnosis present

## 2020-12-15 DIAGNOSIS — C50412 Malignant neoplasm of upper-outer quadrant of left female breast: Secondary | ICD-10-CM | POA: Diagnosis present

## 2021-01-05 ENCOUNTER — Encounter: Payer: Self-pay | Admitting: Adult Health

## 2021-02-14 IMAGING — CT CT CHEST LUNG CANCER SCREENING LOW DOSE W/O CM
1 series · 10 of 10 positions shown, 13 images · non-contrast
Comparison: Standard CT chest 11/08/2017

CLINICAL DATA: 62-year-old female with 38 pack-year history of
smoking. Lung cancer screening.

EXAM:
CT CHEST WITHOUT CONTRAST LOW-DOSE FOR LUNG CANCER SCREENING
TECHNIQUE: Multidetector CT imaging of the chest was performed following the
standard protocol without IV contrast.

[ct lung segmentation data · axial · 0.72mm/px · z∈[+1314,+1314]mm · 10 of 308 frames shown]
[frame 1/308  mediastinal]
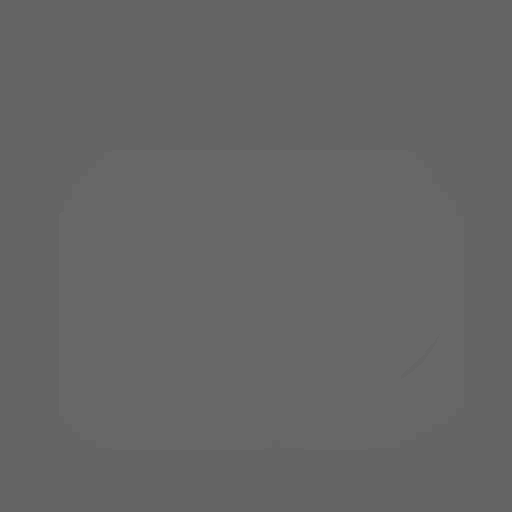
[frame 1/308  lung]
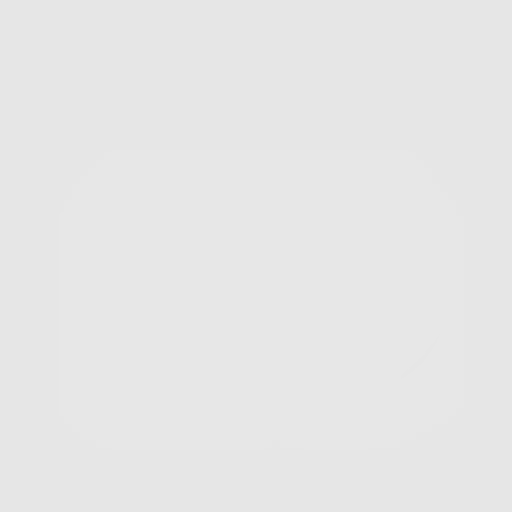
[frame 35/308  lung]
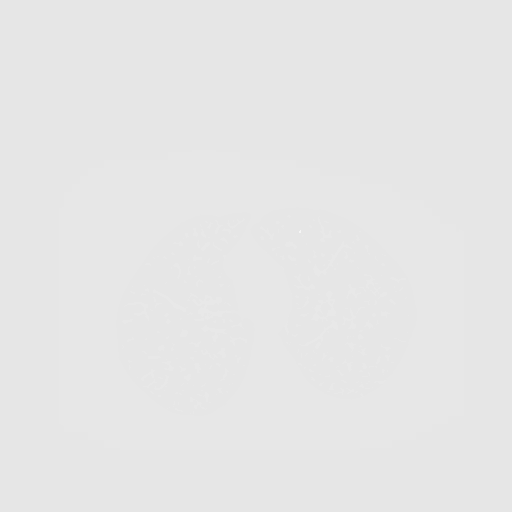
[frame 69/308  lung]
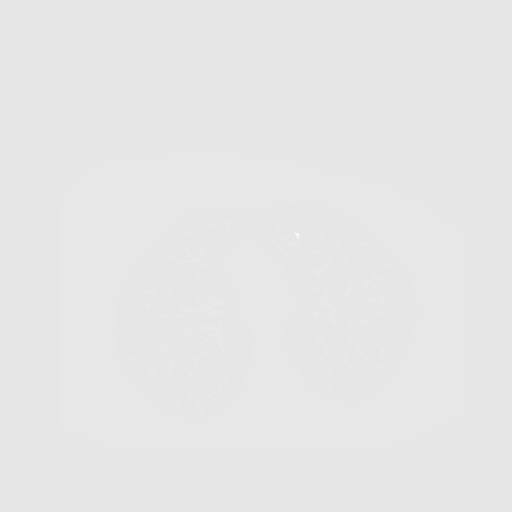
[frame 103/308  lung]
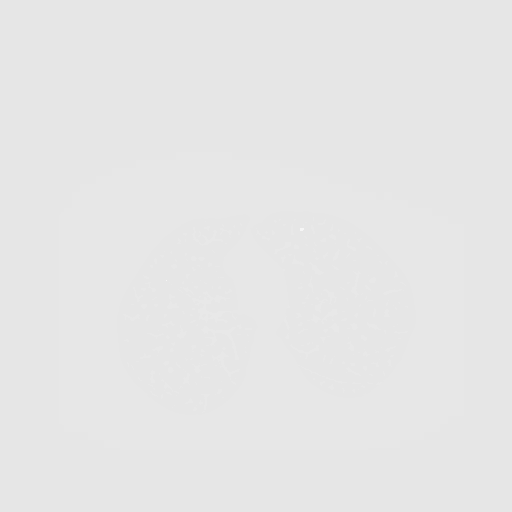
[frame 137/308  mediastinal]
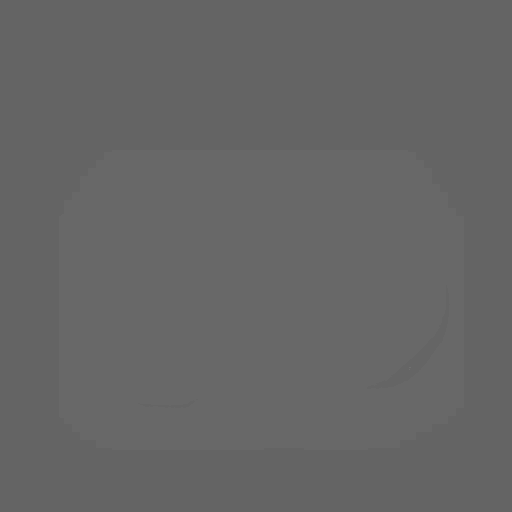
[frame 137/308  lung]
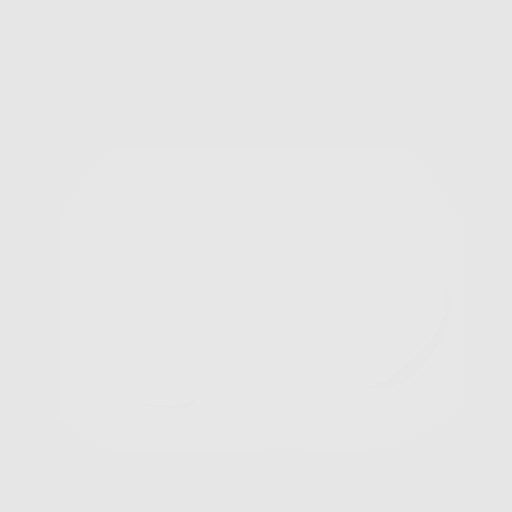
[frame 171/308  lung]
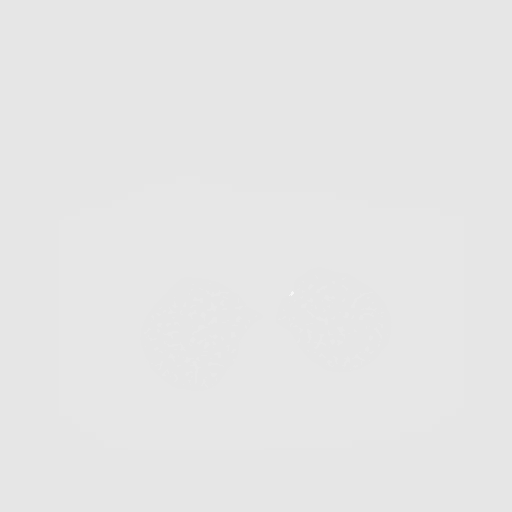
[frame 205/308  lung]
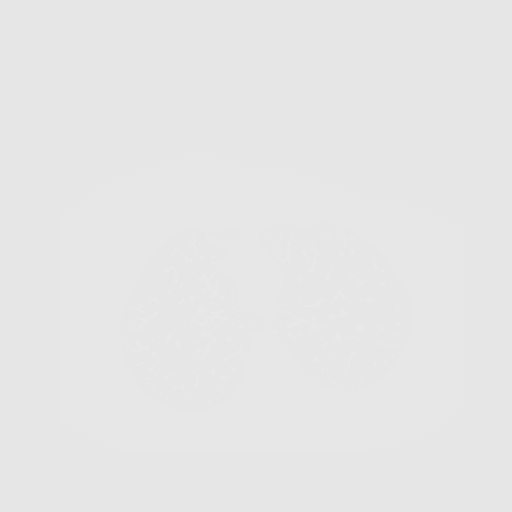
[frame 239/308  lung]
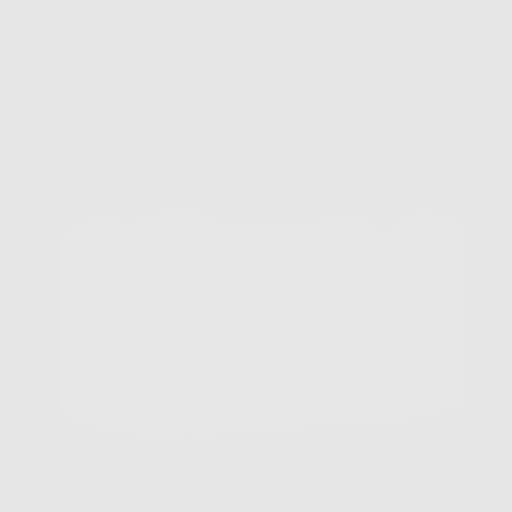
[frame 273/308  mediastinal]
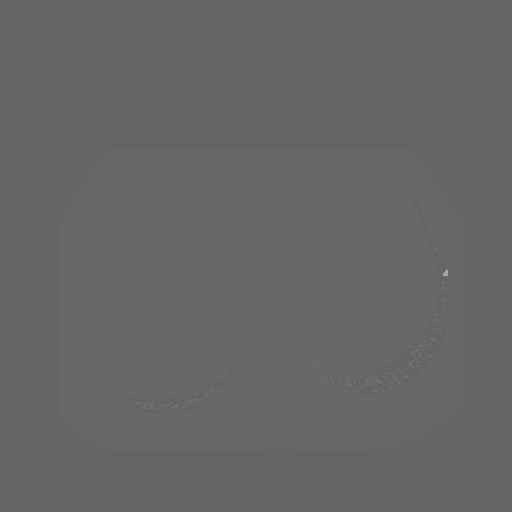
[frame 273/308  lung]
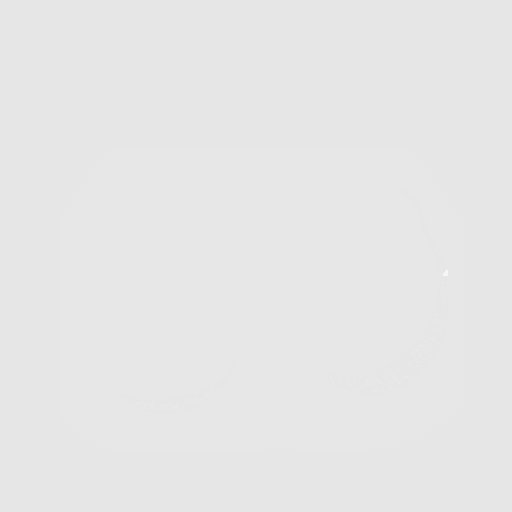
[frame 308/308  lung]
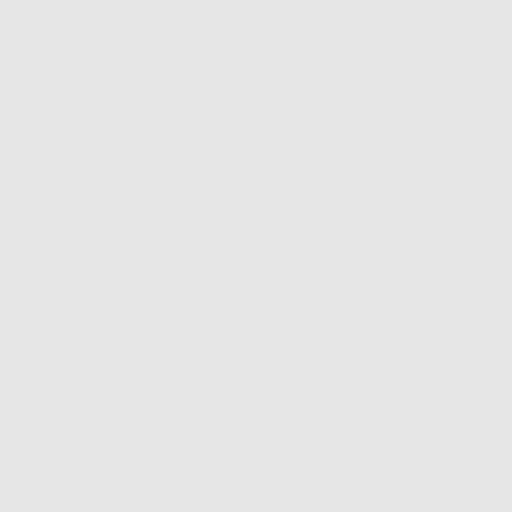

[10 of 10 positions shown; findings below may reference images not displayed]

FINDINGS: Cardiovascular: The heart size is normal. No substantial pericardial
effusion. Atherosclerotic calcification is noted in the wall of the
thoracic aorta.

Mediastinum/Nodes: No mediastinal lymphadenopathy. No evidence for
gross hilar lymphadenopathy although assessment is limited by the
lack of intravenous contrast on today's study. The esophagus has
normal imaging features. There is no axillary lymphadenopathy.

Lungs/Pleura: The central tracheobronchial airways are patent.
Biapical pleuroparenchymal scarring noted. Centrilobular emphsyema
noted. Scattered bilateral pulmonary nodules measure up to maximum
volume derived equivalent diameter of 4.6 mm. There is a cystic area
in the central left upper lobe which is similar to prior without
appreciable soft tissue component..

Upper Abdomen: Unremarkable.

Musculoskeletal: No worrisome lytic or sclerotic osseous
abnormality.
IMPRESSION: 1. Lung-RADS 2, benign appearance or behavior. Continue annual
screening with low-dose chest CT without contrast in 12 months.
2.  Aortic Atherosclerois (010XX-170.0)
3.  Emphysema. (010XX-XQX.K)

## 2021-03-05 ENCOUNTER — Other Ambulatory Visit: Payer: Self-pay

## 2021-03-05 ENCOUNTER — Ambulatory Visit (HOSPITAL_COMMUNITY)
Admission: RE | Admit: 2021-03-05 | Discharge: 2021-03-05 | Disposition: A | Discharge status: Home / Self Care | Payer: BC Managed Care – PPO | Source: Ambulatory Visit | Attending: Adult Health | Admitting: Adult Health

## 2021-03-05 DIAGNOSIS — C50412 Malignant neoplasm of upper-outer quadrant of left female breast: Secondary | ICD-10-CM | POA: Insufficient documentation

## 2021-03-05 MED ORDER — GADOBUTROL 1 MMOL/ML IV SOLN
10.0000 mL | Freq: Once | INTRAVENOUS | Status: AC | PRN
  Administered 2021-03-05: 10 mL via INTRAVENOUS

## 2021-03-08 ENCOUNTER — Encounter: Payer: Self-pay | Admitting: Adult Health

## 2021-03-16 ENCOUNTER — Other Ambulatory Visit: Payer: Self-pay

## 2021-03-16 ENCOUNTER — Encounter: Payer: Self-pay | Admitting: Dermatology

## 2021-03-16 ENCOUNTER — Ambulatory Visit: Payer: BC Managed Care – PPO | Admitting: Dermatology

## 2021-03-16 DIAGNOSIS — E8889 Other specified metabolic disorders: Secondary | ICD-10-CM | POA: Diagnosis not present

## 2021-03-16 DIAGNOSIS — Z1283 Encounter for screening for malignant neoplasm of skin: Secondary | ICD-10-CM

## 2021-03-16 DIAGNOSIS — L918 Other hypertrophic disorders of the skin: Secondary | ICD-10-CM

## 2021-03-16 DIAGNOSIS — Z86018 Personal history of other benign neoplasm: Secondary | ICD-10-CM

## 2021-03-16 DIAGNOSIS — L821 Other seborrheic keratosis: Secondary | ICD-10-CM

## 2021-03-16 DIAGNOSIS — D239 Other benign neoplasm of skin, unspecified: Secondary | ICD-10-CM

## 2021-03-16 DIAGNOSIS — Z85820 Personal history of malignant melanoma of skin: Secondary | ICD-10-CM

## 2021-03-16 DIAGNOSIS — D2371 Other benign neoplasm of skin of right lower limb, including hip: Secondary | ICD-10-CM

## 2021-03-22 ENCOUNTER — Encounter: Payer: Self-pay | Admitting: Dermatology

## 2021-03-22 NOTE — Progress Notes (Signed)
   Follow-Up Visit   Subjective  Holly Melton is a 65 y.o. female who presents for the following: New Patient (Initial Visit) (Patient here today for yearly skin check. No concerns today. Per patient her sister has had melanoma twice. Personal history of atypical moles).  General skin examination Location:  Duration:  Quality:  Associated Signs/Symptoms: Modifying Factors:  Severity:  Timing: Context: Family history of melanoma  Objective  Well appearing patient in no apparent distress; mood and affect are within normal limits. Trunk 4 to 53mm brown textured flattopped papules  Neck Fleshy, skin-colored pedunculated papules.    Right Lower Leg - Anterior Firm pink 5 mm dermal papule; dermoscopy confirms.  Torso - Posterior (Back) No atypical pigmented lesions on general examination.    A full examination was performed including scalp, head, eyes, ears, nose, lips, neck, chest, axillae, abdomen, back, buttocks, bilateral upper extremities, bilateral lower extremities, hands, feet, fingers, toes, fingernails, and toenails. All findings within normal limits unless otherwise noted below.  Areas beneath undergarments not fully examined.   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  Dermatofibroma Right Lower Leg - Anterior  Leave if stable  Encounter for screening for malignant neoplasm of skin Torso - Posterior (Back)  Annual skin examination.  Patient encouraged to self examine twice annually.  Continue ultraviolet protection.      I, Lavonna Monarch, MD, have reviewed all documentation for this visit.  The documentation on 03/22/21 for the exam, diagnosis, procedures, and orders are all accurate and complete.

## 2021-05-13 ENCOUNTER — Other Ambulatory Visit: Payer: BC Managed Care – PPO
